# Patient Record
Sex: Male | Born: 1955
Health system: Southern US, Community
[De-identification: ages and names within clinical notes are randomized; demographics above are authoritative.]

## PROBLEM LIST (undated history)

## (undated) DIAGNOSIS — G43909 Migraine, unspecified, not intractable, without status migrainosus: Secondary | ICD-10-CM

## (undated) DIAGNOSIS — K5792 Diverticulitis of intestine, part unspecified, without perforation or abscess without bleeding: Secondary | ICD-10-CM

## (undated) HISTORY — DX: Diverticulitis of intestine, part unspecified, without perforation or abscess without bleeding: K57.92

## (undated) HISTORY — DX: Migraine, unspecified, not intractable, without status migrainosus: G43.909

---

## 2002-08-21 ENCOUNTER — Other Ambulatory Visit: Admission: RE | Admit: 2002-08-21 | Discharge: 2002-08-21 | Payer: Self-pay | Admitting: Dermatology

## 2007-04-13 HISTORY — PX: COLONOSCOPY: SHX174

## 2016-06-30 DIAGNOSIS — D485 Neoplasm of uncertain behavior of skin: Secondary | ICD-10-CM | POA: Diagnosis not present

## 2016-06-30 DIAGNOSIS — L570 Actinic keratosis: Secondary | ICD-10-CM | POA: Diagnosis not present

## 2016-06-30 DIAGNOSIS — Z85828 Personal history of other malignant neoplasm of skin: Secondary | ICD-10-CM | POA: Diagnosis not present

## 2017-01-04 DIAGNOSIS — L259 Unspecified contact dermatitis, unspecified cause: Secondary | ICD-10-CM | POA: Diagnosis not present

## 2017-01-20 DIAGNOSIS — K5700 Diverticulitis of small intestine with perforation and abscess without bleeding: Secondary | ICD-10-CM | POA: Diagnosis not present

## 2017-03-16 DIAGNOSIS — G43909 Migraine, unspecified, not intractable, without status migrainosus: Secondary | ICD-10-CM | POA: Diagnosis not present

## 2017-03-16 DIAGNOSIS — R7301 Impaired fasting glucose: Secondary | ICD-10-CM | POA: Diagnosis not present

## 2017-03-16 DIAGNOSIS — Z79899 Other long term (current) drug therapy: Secondary | ICD-10-CM | POA: Diagnosis not present

## 2017-03-24 DIAGNOSIS — R7301 Impaired fasting glucose: Secondary | ICD-10-CM | POA: Diagnosis not present

## 2017-03-24 DIAGNOSIS — R079 Chest pain, unspecified: Secondary | ICD-10-CM | POA: Diagnosis not present

## 2017-03-24 DIAGNOSIS — Z0001 Encounter for general adult medical examination with abnormal findings: Secondary | ICD-10-CM | POA: Diagnosis not present

## 2017-03-24 DIAGNOSIS — G43909 Migraine, unspecified, not intractable, without status migrainosus: Secondary | ICD-10-CM | POA: Diagnosis not present

## 2017-05-30 DIAGNOSIS — K5792 Diverticulitis of intestine, part unspecified, without perforation or abscess without bleeding: Secondary | ICD-10-CM | POA: Diagnosis not present

## 2017-07-04 DIAGNOSIS — L570 Actinic keratosis: Secondary | ICD-10-CM | POA: Diagnosis not present

## 2017-10-25 ENCOUNTER — Encounter (INDEPENDENT_AMBULATORY_CARE_PROVIDER_SITE_OTHER): Payer: Self-pay | Admitting: *Deleted

## 2017-11-11 DIAGNOSIS — M5136 Other intervertebral disc degeneration, lumbar region: Secondary | ICD-10-CM | POA: Diagnosis not present

## 2017-11-17 ENCOUNTER — Ambulatory Visit (INDEPENDENT_AMBULATORY_CARE_PROVIDER_SITE_OTHER): Payer: 59 | Admitting: Internal Medicine

## 2017-11-17 ENCOUNTER — Encounter (INDEPENDENT_AMBULATORY_CARE_PROVIDER_SITE_OTHER): Payer: Self-pay | Admitting: *Deleted

## 2017-11-17 ENCOUNTER — Encounter (INDEPENDENT_AMBULATORY_CARE_PROVIDER_SITE_OTHER): Payer: Self-pay | Admitting: Internal Medicine

## 2017-11-17 VITALS — BP 140/80 | HR 72 | Temp 98.0°F | Ht 72.0 in | Wt 223.3 lb

## 2017-11-17 DIAGNOSIS — K5732 Diverticulitis of large intestine without perforation or abscess without bleeding: Secondary | ICD-10-CM | POA: Insufficient documentation

## 2017-11-17 DIAGNOSIS — K625 Hemorrhage of anus and rectum: Secondary | ICD-10-CM

## 2017-11-17 NOTE — Patient Instructions (Signed)
The risks of bleeding, perforation and infection were reviewed with patient.  

## 2017-11-17 NOTE — Progress Notes (Signed)
   Subjective:    Patient ID: Kevin Potts, male    DOB: 04-Apr-1956, 62 y.o.   MRN: [id number]  HPI Referred by Dr Willey Blade for screening colonoscopy.  Has been 10 yrs since his last colonoscopy. Last episode of diverticulitis was in February and was treated with 2 antibiotics. Has had 3-4 bouts of diverticulitis in the past.   Last colonoscopy in 2009. Pancolonic diverticulosis. Most in sigmoid colon. (Dr. Laural Golden) Appetite is good. No weight loss. No abdominal pain.  BMs daily. Has seen blood on occasion on the toilet tissue.     03/16/2017 H and H 14.5 and 42.0   Retired from [employer].  Review of Systems     Past Medical History:  Diagnosis Date  . Diverticulitis   . Migraines     History reviewed. No pertinent surgical history.  No Known Allergies  Current Outpatient Medications on File Prior to Visit  Medication Sig Dispense Refill  . SUMAtriptan (IMITREX) 100 MG tablet Take 100 mg by mouth every 2 (two) hours as needed for migraine. May repeat in 2 hours if headache persists or recurs.    . topiramate (TOPAMAX) 100 MG tablet Take 100 mg by mouth daily.     No current facility-administered medications on file prior to visit.      Objective:   Physical Exam Blood pressure 140/80, pulse 72, temperature 98 F (36.7 C), height 6' (1.829 m), weight 273 lb 4.8 oz (124 kg). Alert and oriented. Skin warm and dry. Oral mucosa is moist.   . Sclera anicteric, conjunctivae is pink. Thyroid not enlarged. No cervical lymphadenopathy. Lungs clear. Heart regular rate and rhythm.  Abdomen is soft. Bowel sounds are positive. No hepatomegaly. No abdominal masses felt. No tenderness.  No edema to lower extremities.           Assessment & Plan:  Rectal bleeding on occasion. Needs colonoscopy. Diverticulitis. Has had about 4 bouts. Needs surveillance colonoscopy to be sure he does not have an underlying cancer.

## 2017-11-21 DIAGNOSIS — S338XXA Sprain of other parts of lumbar spine and pelvis, initial encounter: Secondary | ICD-10-CM | POA: Diagnosis not present

## 2017-11-21 DIAGNOSIS — S73101A Unspecified sprain of right hip, initial encounter: Secondary | ICD-10-CM | POA: Diagnosis not present

## 2017-11-23 DIAGNOSIS — S338XXA Sprain of other parts of lumbar spine and pelvis, initial encounter: Secondary | ICD-10-CM | POA: Diagnosis not present

## 2017-11-23 DIAGNOSIS — S73101A Unspecified sprain of right hip, initial encounter: Secondary | ICD-10-CM | POA: Diagnosis not present

## 2017-11-25 DIAGNOSIS — S338XXA Sprain of other parts of lumbar spine and pelvis, initial encounter: Secondary | ICD-10-CM | POA: Diagnosis not present

## 2017-11-25 DIAGNOSIS — S73101A Unspecified sprain of right hip, initial encounter: Secondary | ICD-10-CM | POA: Diagnosis not present

## 2017-11-28 ENCOUNTER — Ambulatory Visit (HOSPITAL_COMMUNITY)
Admission: RE | Admit: 2017-11-28 | Discharge: 2017-11-28 | Disposition: A | Discharge status: Home / Self Care | Payer: 59 | Source: Ambulatory Visit | Attending: Internal Medicine | Admitting: Internal Medicine

## 2017-11-28 ENCOUNTER — Encounter (HOSPITAL_COMMUNITY)
Admission: RE | Disposition: A | Discharge status: Home / Self Care | Payer: Self-pay | Source: Ambulatory Visit | Attending: Internal Medicine

## 2017-11-28 ENCOUNTER — Other Ambulatory Visit: Payer: Self-pay

## 2017-11-28 DIAGNOSIS — G43909 Migraine, unspecified, not intractable, without status migrainosus: Secondary | ICD-10-CM | POA: Insufficient documentation

## 2017-11-28 DIAGNOSIS — D214 Benign neoplasm of connective and other soft tissue of abdomen: Secondary | ICD-10-CM | POA: Diagnosis not present

## 2017-11-28 DIAGNOSIS — K648 Other hemorrhoids: Secondary | ICD-10-CM | POA: Diagnosis not present

## 2017-11-28 DIAGNOSIS — Z79899 Other long term (current) drug therapy: Secondary | ICD-10-CM | POA: Diagnosis not present

## 2017-11-28 DIAGNOSIS — K5730 Diverticulosis of large intestine without perforation or abscess without bleeding: Secondary | ICD-10-CM | POA: Insufficient documentation

## 2017-11-28 DIAGNOSIS — D123 Benign neoplasm of transverse colon: Secondary | ICD-10-CM | POA: Diagnosis not present

## 2017-11-28 DIAGNOSIS — K625 Hemorrhage of anus and rectum: Secondary | ICD-10-CM

## 2017-11-28 DIAGNOSIS — Z1211 Encounter for screening for malignant neoplasm of colon: Secondary | ICD-10-CM | POA: Diagnosis not present

## 2017-11-28 DIAGNOSIS — K644 Residual hemorrhoidal skin tags: Secondary | ICD-10-CM | POA: Insufficient documentation

## 2017-11-28 DIAGNOSIS — K5732 Diverticulitis of large intestine without perforation or abscess without bleeding: Secondary | ICD-10-CM

## 2017-11-28 HISTORY — PX: COLONOSCOPY: SHX5424

## 2017-11-28 HISTORY — PX: POLYPECTOMY: SHX5525

## 2017-11-28 SURGERY — COLONOSCOPY
Anesthesia: Moderate Sedation

## 2017-11-28 MED ORDER — SODIUM CHLORIDE 0.9 % IV SOLN
INTRAVENOUS | Status: DC
  Administered 2017-11-28: 09:00:00 via INTRAVENOUS

## 2017-11-28 MED ORDER — MIDAZOLAM HCL 5 MG/5ML IJ SOLN
INTRAMUSCULAR | Status: DC | PRN
  Administered 2017-11-28: 2 mg via INTRAVENOUS
  Administered 2017-11-28 (×2): 1 mg via INTRAVENOUS

## 2017-11-28 MED ORDER — MEPERIDINE HCL 50 MG/ML IJ SOLN
INTRAMUSCULAR | Status: AC
  Filled 2017-11-28: qty 1

## 2017-11-28 MED ORDER — MEPERIDINE HCL 50 MG/ML IJ SOLN
INTRAMUSCULAR | Status: DC | PRN
  Administered 2017-11-28: 25 mg

## 2017-11-28 MED ORDER — BENEFIBER DRINK MIX PO PACK: 4.0000 g | PACK | Freq: Every day | ORAL | Status: AC

## 2017-11-28 MED ORDER — MIDAZOLAM HCL 5 MG/5ML IJ SOLN
INTRAMUSCULAR | Status: AC
  Filled 2017-11-28: qty 10

## 2017-11-28 NOTE — Op Note (Addendum)
[facility] Patient Name: Kevin Potts Procedure Date: 11/28/2017 8:58 AM MRN: [id number] Date of Birth: 08-09-1955 Attending MD: Hildred Laser , MD CSN: [id number] Age: 62 Admit Type: Outpatient Procedure:                Colonoscopy Indications:              Screening for colorectal malignant neoplasm Providers:                Hildred Laser, MD, Janeece Riggers, RN Referring MD:             Asencion Noble, MD Medicines:                Meperidine 25 mg IV, Midazolam 4 mg IV Complications:            No immediate complications. Estimated Blood Loss:     Estimated blood loss was minimal. Procedure:                Pre-Anesthesia Assessment:                           - Prior to the procedure, a History and Physical                            was performed, and patient medications and                            allergies were reviewed. The patient's tolerance of                            previous anesthesia was also reviewed. The risks                            and benefits of the procedure and the sedation                            options and risks were discussed with the patient.                            All questions were answered, and informed consent                            was obtained. Prior Anticoagulants: The patient has                            taken no previous anticoagulant or antiplatelet                            agents. ASA Grade Assessment: I - A normal, healthy                            patient. After reviewing the risks and benefits,                            the patient was deemed in satisfactory condition to  undergo the procedure.                           After obtaining informed consent, the colonoscope                            was passed under direct vision. Throughout the                            procedure, the patient's blood pressure, pulse, and                            oxygen saturations were monitored continuously.  The                            [id number]-[id number] ([id number]) scope was introduced through                            the anus and advanced to the the cecum, identified                            by appendiceal orifice and ileocecal valve. The                            colonoscopy was performed without difficulty. The                            patient tolerated the procedure well. The quality                            of the bowel preparation was good. The ileocecal                            valve, appendiceal orifice, and rectum were                            photographed. Scope In: 9:21:54 AM Scope Out: 9:37:27 AM Scope Withdrawal Time: 0 hours 10 minutes 57 seconds  Total Procedure Duration: 0 hours 15 minutes 33 seconds  Findings:      The perianal and digital rectal examinations were normal.      Scattered medium-mouthed diverticula were found in the descending colon,       splenic flexure, transverse colon and hepatic flexure.      Multiple small and large-mouthed diverticula were found in the sigmoid       colon.      A small polyp was found in the hepatic flexure. The polyp was sessile.       Biopsies were taken with a cold forceps for histology.      External and internal hemorrhoids were found during retroflexion. The       hemorrhoids were small. Impression:               - Diverticulosis in the descending colon, at the  splenic flexure, in the transverse colon and at the                            hepatic flexure.                           - Diverticulosis in the sigmoid colon.                           - One small polyp at the hepatic flexure. Biopsied.                           - External and internal hemorrhoids. Moderate Sedation:      Moderate (conscious) sedation was administered by the endoscopy nurse       and supervised by the endoscopist. The following parameters were       monitored: oxygen saturation, heart rate, blood pressure, CO2        capnography and response to care. Total physician intraservice time was       23 minutes. Recommendation:           - Patient has a contact number available for                            emergencies. The signs and symptoms of potential                            delayed complications were discussed with the                            patient. Return to normal activities tomorrow.                            Written discharge instructions were provided to the                            patient.                           - High fiber diet today.                           - Continue present medications.                           - No aspirin, ibuprofen, naproxen, or other                            non-steroidal anti-inflammatory drugs for 1 day.                           - Use Benefiber 4 g po qhs.                           - Await pathology results.                           -  Repeat colonoscopy date to be determined after                            pending pathology results are reviewed. Procedure Code(s):        --- Professional ---                           [phone], Colonoscopy, flexible; with biopsy, single                            or multiple                           G0500, Moderate sedation services provided by the                            same physician or other qualified health care                            professional performing a gastrointestinal                            endoscopic service that sedation supports,                            requiring the presence of an independent trained                            observer to assist in the monitoring of the                            patient's level of consciousness and physiological                            status; initial 15 minutes of intra-service time;                            patient age 61 years or older (additional time may                            be reported with [phone], as appropriate)                            [phone], Moderate sedation services provided by the                            same physician or other qualified health care                            professional performing the diagnostic or                            therapeutic service that the sedation supports,  requiring the presence of an independent trained                            observer to assist in the monitoring of the                            patient's level of consciousness and physiological                            status; each additional 15 minutes intraservice                            time (List separately in addition to code for                            primary service) Diagnosis Code(s):        --- Professional ---                           Z12.11, Encounter for screening for malignant                            neoplasm of colon                           K64.8, Other hemorrhoids                           D12.3, Benign neoplasm of transverse colon (hepatic                            flexure or splenic flexure)                           K57.30, Diverticulosis of large intestine without                            perforation or abscess without bleeding CPT copyright 2017 American Medical Association. All rights reserved. The codes documented in this report are preliminary and upon coder review may  be revised to meet current compliance requirements. Hildred Laser, MD Hildred Laser, MD 11/28/2017 9:46:08 AM This report has been signed electronically. Number of Addenda: 0

## 2017-11-28 NOTE — Discharge Instructions (Signed)
Resume usual medications as before. Benefiber or equivalent 4 g p.o. Nightly. High-fiber diet. No driving for 24 hours. Physician will call with biopsy results.   Colonoscopy, Adult, Care After This sheet gives you information about how to care for yourself after your procedure. Your health care provider may also give you more specific instructions. If you have problems or questions, contact your health care provider. What can I expect after the procedure? After the procedure, it is common to have:  A small amount of blood in your stool for 24 hours after the procedure.  Some gas.  Mild abdominal cramping or bloating.  Follow these instructions at home: General instructions   For the first 24 hours after the procedure: ? Do not drive or use machinery. ? Do not sign important documents. ? Do not drink alcohol. ? Do your regular daily activities at a slower pace than normal. ? Eat soft, easy-to-digest foods. ? Rest often.  Take over-the-counter or prescription medicines only as told by your health care provider.  It is up to you to get the results of your procedure. Ask your health care provider, or the department performing the procedure, when your results will be ready. Relieving cramping and bloating  Try walking around when you have cramps or feel bloated.  Apply heat to your abdomen as told by your health care provider. Use a heat source that your health care provider recommends, such as a moist heat pack or a heating pad. ? Place a towel between your skin and the heat source. ? Leave the heat on for 20-30 minutes. ? Remove the heat if your skin turns bright red. This is especially important if you are unable to feel pain, heat, or cold. You may have a greater risk of getting burned. Eating and drinking  Drink enough fluid to keep your urine clear or pale yellow.  Resume your normal diet as instructed by your health care provider. Avoid heavy or fried foods that are  hard to digest.  Avoid drinking alcohol for as long as instructed by your health care provider. Contact a health care provider if:  You have blood in your stool 2-3 days after the procedure. Get help right away if:  You have more than a small spotting of blood in your stool.  You pass large blood clots in your stool.  Your abdomen is swollen.  You have nausea or vomiting.  You have a fever.  You have increasing abdominal pain that is not relieved with medicine. This information is not intended to replace advice given to you by your health care provider. Make sure you discuss any questions you have with your health care provider. Document Released: 11/11/2003 Document Revised: 12/22/2015 Document Reviewed: 06/10/2015 Elsevier Interactive Patient Education  2018 [employer].  Colon Polyps Polyps are tissue growths inside the body. Polyps can grow in many places, including the large intestine (colon). A polyp may be a round bump or a mushroom-shaped growth. You could have one polyp or several. Most colon polyps are noncancerous (benign). However, some colon polyps can become cancerous over time. What are the causes? The exact cause of colon polyps is not known. What increases the risk? This condition is more likely to develop in people who:  Have a family history of colon cancer or colon polyps.  Are older than 2 or older than 45 if they are African American.  Have inflammatory bowel disease, such as ulcerative colitis or Crohn disease.  Are overweight.  Smoke  cigarettes.  Do not get enough exercise.  Drink too much alcohol.  Eat a diet that is: ? High in fat and red meat. ? Low in fiber.  Had childhood cancer that was treated with abdominal radiation.  What are the signs or symptoms? Most polyps do not cause symptoms. If you have symptoms, they may include:  Blood coming from your rectum when having a bowel movement.  Blood in your stool.The stool may look  dark red or black.  A change in bowel habits, such as constipation or diarrhea.  How is this diagnosed? This condition is diagnosed with a colonoscopy. This is a procedure that uses a lighted, flexible scope to look at the inside of your colon. How is this treated? Treatment for this condition involves removing any polyps that are found. Those polyps will then be tested for cancer. If cancer is found, your health care provider will talk to you about options for colon cancer treatment. Follow these instructions at home: Diet  Eat plenty of fiber, such as fruits, vegetables, and whole grains.  Eat foods that are high in calcium and vitamin D, such as milk, cheese, yogurt, eggs, liver, fish, and broccoli.  Limit foods high in fat, red meats, and processed meats, such as hot dogs, sausage, bacon, and lunch meats.  Maintain a healthy weight, or lose weight if recommended by your health care provider. General instructions  Do not smoke cigarettes.  Do not drink alcohol excessively.  Keep all follow-up visits as told by your health care provider. This is important. This includes keeping regularly scheduled colonoscopies. Talk to your health care provider about when you need a colonoscopy.  Exercise every day or as told by your health care provider. Contact a health care provider if:  You have new or worsening bleeding during a bowel movement.  You have new or increased blood in your stool.  You have a change in bowel habits.  You unexpectedly lose weight. This information is not intended to replace advice given to you by your health care provider. Make sure you discuss any questions you have with your health care provider. Document Released: 12/24/2003 Document Revised: 09/04/2015 Document Reviewed: 02/17/2015 Elsevier Interactive Patient Education  [employer].  Diverticulosis Diverticulosis is a condition that develops when small pouches (diverticula) form in the wall of  the large intestine (colon). The colon is where water is absorbed and stool is formed. The pouches form when the inside layer of the colon pushes through weak spots in the outer layers of the colon. You may have a few pouches or many of them. What are the causes? The cause of this condition is not known. What increases the risk? The following factors may make you more likely to develop this condition:  Being older than age 49. Your risk for this condition increases with age. Diverticulosis is rare among people younger than age 8. By age 76, many people have it.  Eating a low-fiber diet.  Having frequent constipation.  Being overweight.  Not getting enough exercise.  Smoking.  Taking over-the-counter pain medicines, like aspirin and ibuprofen.  Having a family history of diverticulosis.  What are the signs or symptoms? In most people, there are no symptoms of this condition. If you do have symptoms, they may include:  Bloating.  Cramps in the abdomen.  Constipation or diarrhea.  Pain in the lower left side of the abdomen.  How is this diagnosed? This condition is most often diagnosed during an exam  for other colon problems. Because diverticulosis usually has no symptoms, it often cannot be diagnosed independently. This condition may be diagnosed by:  Using a flexible scope to examine the colon (colonoscopy).  Taking an X-ray of the colon after dye has been put into the colon (barium enema).  Doing a CT scan.  How is this treated? You may not need treatment for this condition if you have never developed an infection related to diverticulosis. If you have had an infection before, treatment may include:  Eating a high-fiber diet. This may include eating more fruits, vegetables, and grains.  Taking a fiber supplement.  Taking a live bacteria supplement (probiotic).  Taking medicine to relax your colon.  Taking antibiotic medicines.  Follow these instructions at  home:  Drink 6-8 glasses of water or more each day to prevent constipation.  Try not to strain when you have a bowel movement.  If you have had an infection before: ? Eat more fiber as directed by your health care provider or your diet and nutrition specialist (dietitian). ? Take a fiber supplement or probiotic, if your health care provider approves.  Take over-the-counter and prescription medicines only as told by your health care provider.  If you were prescribed an antibiotic, take it as told by your health care provider. Do not stop taking the antibiotic even if you start to feel better.  Keep all follow-up visits as told by your health care provider. This is important. Contact a health care provider if:  You have pain in your abdomen.  You have bloating.  You have cramps.  You have not had a bowel movement in 3 days. Get help right away if:  Your pain gets worse.  Your bloating becomes very bad.  You have a fever or chills, and your symptoms suddenly get worse.  You vomit.  You have bowel movements that are bloody or black.  You have bleeding from your rectum. Summary  Diverticulosis is a condition that develops when small pouches (diverticula) form in the wall of the large intestine (colon).  You may have a few pouches or many of them.  This condition is most often diagnosed during an exam for other colon problems.  If you have had an infection related to diverticulosis, treatment may include increasing the fiber in your diet, taking supplements, or taking medicines. This information is not intended to replace advice given to you by your health care provider. Make sure you discuss any questions you have with your health care provider. Document Released: 12/25/2003 Document Revised: 02/16/2016 Document Reviewed: 02/16/2016 Elsevier Interactive Patient Education  2017 [employer].

## 2017-11-28 NOTE — H&P (Signed)
Kevin Potts is an 62 y.o. male.   Chief Complaint: Patient is here for colonoscopy. HPI: Patient 62 year old Caucasian male who is here for screening colonoscopy.  Last exam was 10 years ago.  He states he was treated for diverticulitis in February this year and he had another episode year before.  He also gives history of intermittent hematochezia felt to be secondary to hemorrhoids.  He denies abdominal pain.  He does not take any aspirin or NSAIDs. Family history is negative for CRC.  Past Medical History:  Diagnosis Date  . Diverticulitis   . Migraines     Past Surgical History:  Procedure Laterality Date  . COLONOSCOPY  2009    No family history on file. Social History:  reports that he has never smoked. He has never used smokeless tobacco. He reports that he drinks alcohol. He reports that he does not use drugs.  Allergies: No Known Allergies  Medications Prior to Admission  Medication Sig Dispense Refill  . Multiple Vitamins-Minerals (MULTIVITAMIN WITH MINERALS) tablet Take 1 tablet by mouth daily.    . SUMAtriptan (IMITREX) 100 MG tablet Take 100 mg by mouth every 2 (two) hours as needed for migraine. May repeat in 2 hours if headache persists or recurs.    . topiramate (TOPAMAX) 100 MG tablet Take 100 mg by mouth daily.      No results found for this or any previous visit (from the past 48 hour(s)). No results found.  ROS  Blood pressure (!) 126/91, pulse 76, temperature 97.9 F (36.6 C), temperature source Oral, resp. rate 12, SpO2 100 %. Physical Exam  Constitutional: He appears well-developed and well-nourished.  HENT:  Mouth/Throat: Oropharynx is clear and moist.  Eyes: Conjunctivae are normal. No scleral icterus.  Neck: No thyromegaly present.  Cardiovascular: Normal rate, regular rhythm and normal heart sounds.  No murmur heard. Respiratory: Effort normal and breath sounds normal.  GI: Soft. He exhibits no distension and no mass. There is no tenderness.   Musculoskeletal: He exhibits no edema.  Lymphadenopathy:    He has no cervical adenopathy.  Neurological: He is alert.  Skin: Skin is warm and dry.     Assessment/Plan Average risk screening colonoscopy.  Kevin Laser, MD 11/28/2017, 9:11 AM

## 2017-11-30 DIAGNOSIS — S338XXA Sprain of other parts of lumbar spine and pelvis, initial encounter: Secondary | ICD-10-CM | POA: Diagnosis not present

## 2017-11-30 DIAGNOSIS — S73101A Unspecified sprain of right hip, initial encounter: Secondary | ICD-10-CM | POA: Diagnosis not present

## 2017-12-01 ENCOUNTER — Encounter (HOSPITAL_COMMUNITY): Payer: Self-pay | Admitting: Internal Medicine

## 2017-12-02 DIAGNOSIS — S338XXA Sprain of other parts of lumbar spine and pelvis, initial encounter: Secondary | ICD-10-CM | POA: Diagnosis not present

## 2017-12-02 DIAGNOSIS — S73101A Unspecified sprain of right hip, initial encounter: Secondary | ICD-10-CM | POA: Diagnosis not present

## 2017-12-07 DIAGNOSIS — S338XXA Sprain of other parts of lumbar spine and pelvis, initial encounter: Secondary | ICD-10-CM | POA: Diagnosis not present

## 2017-12-07 DIAGNOSIS — S73101A Unspecified sprain of right hip, initial encounter: Secondary | ICD-10-CM | POA: Diagnosis not present

## 2017-12-21 DIAGNOSIS — S338XXA Sprain of other parts of lumbar spine and pelvis, initial encounter: Secondary | ICD-10-CM | POA: Diagnosis not present

## 2017-12-21 DIAGNOSIS — S73101A Unspecified sprain of right hip, initial encounter: Secondary | ICD-10-CM | POA: Diagnosis not present

## 2018-01-27 DIAGNOSIS — Z23 Encounter for immunization: Secondary | ICD-10-CM | POA: Diagnosis not present

## 2018-02-22 DIAGNOSIS — S73101A Unspecified sprain of right hip, initial encounter: Secondary | ICD-10-CM | POA: Diagnosis not present

## 2018-02-22 DIAGNOSIS — S338XXA Sprain of other parts of lumbar spine and pelvis, initial encounter: Secondary | ICD-10-CM | POA: Diagnosis not present

## 2018-03-17 DIAGNOSIS — N529 Male erectile dysfunction, unspecified: Secondary | ICD-10-CM | POA: Diagnosis not present

## 2018-03-17 DIAGNOSIS — Z79899 Other long term (current) drug therapy: Secondary | ICD-10-CM | POA: Diagnosis not present

## 2018-03-17 DIAGNOSIS — G43909 Migraine, unspecified, not intractable, without status migrainosus: Secondary | ICD-10-CM | POA: Diagnosis not present

## 2018-03-17 DIAGNOSIS — R7301 Impaired fasting glucose: Secondary | ICD-10-CM | POA: Diagnosis not present

## 2018-03-18 DIAGNOSIS — K5732 Diverticulitis of large intestine without perforation or abscess without bleeding: Secondary | ICD-10-CM | POA: Diagnosis not present

## 2018-03-24 DIAGNOSIS — G43909 Migraine, unspecified, not intractable, without status migrainosus: Secondary | ICD-10-CM | POA: Diagnosis not present

## 2018-03-24 DIAGNOSIS — Z0001 Encounter for general adult medical examination with abnormal findings: Secondary | ICD-10-CM | POA: Diagnosis not present

## 2018-03-24 DIAGNOSIS — K5792 Diverticulitis of intestine, part unspecified, without perforation or abscess without bleeding: Secondary | ICD-10-CM | POA: Diagnosis not present

## 2018-04-26 ENCOUNTER — Ambulatory Visit (INDEPENDENT_AMBULATORY_CARE_PROVIDER_SITE_OTHER): Payer: 59 | Admitting: Urology

## 2018-04-26 ENCOUNTER — Other Ambulatory Visit (HOSPITAL_COMMUNITY): Payer: Self-pay | Admitting: Urology

## 2018-04-26 ENCOUNTER — Other Ambulatory Visit: Payer: Self-pay | Admitting: Urology

## 2018-04-26 DIAGNOSIS — N402 Nodular prostate without lower urinary tract symptoms: Secondary | ICD-10-CM

## 2018-05-10 ENCOUNTER — Encounter (HOSPITAL_COMMUNITY): Payer: Self-pay

## 2018-05-10 ENCOUNTER — Ambulatory Visit (HOSPITAL_COMMUNITY)
Admission: RE | Admit: 2018-05-10 | Discharge: 2018-05-10 | Disposition: A | Discharge status: Home / Self Care | Payer: 59 | Source: Ambulatory Visit | Attending: Urology | Admitting: Urology

## 2018-05-10 DIAGNOSIS — N402 Nodular prostate without lower urinary tract symptoms: Secondary | ICD-10-CM

## 2018-05-10 DIAGNOSIS — C61 Malignant neoplasm of prostate: Secondary | ICD-10-CM | POA: Diagnosis not present

## 2018-05-10 MED ORDER — LIDOCAINE HCL (PF) 2 % IJ SOLN
INTRAMUSCULAR | Status: AC
  Filled 2018-05-10: qty 10

## 2018-05-10 MED ORDER — GENTAMICIN SULFATE 40 MG/ML IJ SOLN
80.0000 mg | Freq: Once | INTRAMUSCULAR | Status: AC
  Administered 2018-05-10: 80 mg via INTRAMUSCULAR

## 2018-05-10 MED ORDER — LIDOCAINE HCL (PF) 2 % IJ SOLN
INTRAMUSCULAR | Status: AC
  Administered 2018-05-10: 10 mL
  Filled 2018-05-10: qty 20

## 2018-05-10 MED ORDER — GENTAMICIN SULFATE 40 MG/ML IJ SOLN
INTRAMUSCULAR | Status: AC
  Administered 2018-05-10: 80 mg via INTRAMUSCULAR
  Filled 2018-05-10: qty 2

## 2018-05-17 DIAGNOSIS — S338XXA Sprain of other parts of lumbar spine and pelvis, initial encounter: Secondary | ICD-10-CM | POA: Diagnosis not present

## 2018-05-17 DIAGNOSIS — S73101A Unspecified sprain of right hip, initial encounter: Secondary | ICD-10-CM | POA: Diagnosis not present

## 2018-05-31 ENCOUNTER — Ambulatory Visit (INDEPENDENT_AMBULATORY_CARE_PROVIDER_SITE_OTHER): Payer: 59 | Admitting: Urology

## 2018-05-31 DIAGNOSIS — C61 Malignant neoplasm of prostate: Secondary | ICD-10-CM | POA: Diagnosis not present

## 2018-06-12 DIAGNOSIS — C61 Malignant neoplasm of prostate: Secondary | ICD-10-CM | POA: Diagnosis not present

## 2018-06-14 ENCOUNTER — Other Ambulatory Visit: Payer: Self-pay | Admitting: Urology

## 2018-06-22 DIAGNOSIS — M6281 Muscle weakness (generalized): Secondary | ICD-10-CM | POA: Diagnosis not present

## 2018-06-22 DIAGNOSIS — C61 Malignant neoplasm of prostate: Secondary | ICD-10-CM | POA: Diagnosis not present

## 2018-07-04 DIAGNOSIS — N393 Stress incontinence (female) (male): Secondary | ICD-10-CM | POA: Diagnosis not present

## 2018-07-04 DIAGNOSIS — M6281 Muscle weakness (generalized): Secondary | ICD-10-CM | POA: Diagnosis not present

## 2018-07-04 DIAGNOSIS — M62838 Other muscle spasm: Secondary | ICD-10-CM | POA: Diagnosis not present

## 2018-07-05 DIAGNOSIS — L570 Actinic keratosis: Secondary | ICD-10-CM | POA: Diagnosis not present

## 2018-07-31 DIAGNOSIS — N393 Stress incontinence (female) (male): Secondary | ICD-10-CM | POA: Diagnosis not present

## 2018-07-31 DIAGNOSIS — M62838 Other muscle spasm: Secondary | ICD-10-CM | POA: Diagnosis not present

## 2018-07-31 DIAGNOSIS — M6281 Muscle weakness (generalized): Secondary | ICD-10-CM | POA: Diagnosis not present

## 2018-08-23 ENCOUNTER — Other Ambulatory Visit: Payer: Self-pay | Admitting: Urology

## 2018-08-25 NOTE — Progress Notes (Signed)
SPOKE W/  _george      SCREENING SYMPTOMS OF COVID 19:   COUGH--  RUNNY NOSE---   SORE THROAT---  NASAL CONGESTION----  SNEEZING----  SHORTNESS OF BREATH---  DIFFICULTY BREATHING---  TEMP >100.0 -----  UNEXPLAINED BODY ACHES------  CHILLS --------   HEADACHES ---------  LOSS OF SMELL/ TASTE --------    HAVE YOU OR ANY FAMILY MEMBER TRAVELLED PAST 14 DAYS OUT OF THE   COUNT[place]no  HAVE YOU OR ANY FAMILY MEMBER BEEN EXPOSED TO ANYONE WITH COVID 19?  no

## 2018-08-25 NOTE — Patient Instructions (Signed)
Kevin Potts  08/25/2018      Your procedure is scheduled on:  08/25/2018   Report to [facility] Main  Entrance,  Report to Short Stay  at  5:30 AM   [place] [facility] TEST ON Tuesday 09-05-2018.  THIS TEST MUST BE DONE BEFORE SURGERY, DRIVE UP TO [place],  ENTRANCE [address]ES [address]  BETWEEN THE HOURS OF 9:00 AM AND 3:00 PM     Call this number if you have problems the morning of surgery [phone]       Remember: Do not eat food or drink liquids :After Midnight.   BRUSH YOUR TEETH MORNING OF SURGERY AND RINSE YOUR MOUTH OUT, NO CHEWING GUM CANDY OR MINTS.     Take these medicines the morning of surgery with A SIP OF WATER:   Topiramate (topamax), Imitrex if needed                                 You may not have any metal on your body including piercings              Do not wear jewelry, lotions, powders or perfumes, deodorant                          Men may shave face and neck.   Do not bring valuables to the hospital. [place].  Contacts, dentures or bridgework may not be worn into surgery.        Special Instructions:  Your are to drink one 8oz bottle of magnesium citrate at noon day before surgery                _____________________________________________________________________             [facility] - Preparing for Surgery Before surgery, you can play an important role.  Because skin is not sterile, your skin needs to be as free of germs as possible.  You can reduce the number of germs on your skin by washing with CHG (chlorahexidine gluconate) soap before surgery.  CHG is an antiseptic cleaner which kills germs and bonds with the skin to continue killing germs even after washing. Please DO NOT use if you have an allergy to CHG or antibacterial soaps.  If your skin becomes reddened/irritated stop using the CHG and inform your  nurse when you arrive at Short Stay. Do not shave (including legs and underarms) for at least 48 hours prior to the first CHG shower.  You may shave your face/neck. Please follow these instructions carefully:  1.  Shower with CHG Soap the night before surgery and the  morning of Surgery.  2.  If you choose to wash your hair, wash your hair first as usual with your  normal  shampoo.  3.  After you shampoo, rinse your hair and body thoroughly to remove the  shampoo.                            4.  Use CHG as you would any other liquid soap.  You can apply chg directly  to the skin  and wash                       Gently with a scrungie or clean washcloth.  5.  Apply the CHG Soap to your body ONLY FROM THE NECK DOWN.   Do not use on face/ open                           Wound or open sores. Avoid contact with eyes, ears mouth and genitals (private parts).                       Wash face,  Genitals (private parts) with your normal soap.             6.  Wash thoroughly, paying special attention to the area where your surgery  will be performed.  7.  Thoroughly rinse your body with warm water from the neck down.  8.  DO NOT shower/wash with your normal soap after using and rinsing off  the CHG Soap.             9.  Pat yourself dry with a clean towel.            10.  Wear clean pajamas.            11.  Place clean sheets on your bed the night of your first shower and do not  sleep with pets. Day of Surgery : Do not apply any lotions/deodorants the morning of surgery.  Please wear clean clothes to the hospital/surgery center.  FAILURE TO FOLLOW THESE INSTRUCTIONS MAY RESULT IN THE CANCELLATION OF YOUR SURGERY PATIENT SIGNATURE_________________________________  NURSE SIGNATURE__________________________________  ________________________________________________________________________

## 2018-08-28 ENCOUNTER — Encounter (HOSPITAL_COMMUNITY): Payer: Self-pay

## 2018-08-28 ENCOUNTER — Other Ambulatory Visit: Payer: Self-pay

## 2018-08-28 ENCOUNTER — Encounter (HOSPITAL_COMMUNITY)
Admission: RE | Admit: 2018-08-28 | Discharge: 2018-08-28 | Disposition: A | Discharge status: Home / Self Care | Payer: 59 | Source: Ambulatory Visit | Attending: Urology | Admitting: Urology

## 2018-08-28 DIAGNOSIS — C61 Malignant neoplasm of prostate: Secondary | ICD-10-CM | POA: Diagnosis not present

## 2018-08-28 DIAGNOSIS — R8271 Bacteriuria: Secondary | ICD-10-CM | POA: Diagnosis not present

## 2018-08-28 DIAGNOSIS — Z01818 Encounter for other preprocedural examination: Secondary | ICD-10-CM | POA: Diagnosis not present

## 2018-08-28 LAB — CBC
HCT: 43.1 % (ref 39.0–52.0)
Hemoglobin: 14.4 g/dL (ref 13.0–17.0)
MCH: 30.4 pg (ref 26.0–34.0)
MCHC: 33.4 g/dL (ref 30.0–36.0)
MCV: 90.9 fL (ref 80.0–100.0)
Platelets: 206 10*3/uL (ref 150–400)
RBC: 4.74 MIL/uL (ref 4.22–5.81)
RDW: 13.2 % (ref 11.5–15.5)
WBC: 5.6 10*3/uL (ref 4.0–10.5)
nRBC: 0 % (ref 0.0–0.2)

## 2018-08-28 LAB — BASIC METABOLIC PANEL
Anion gap: 7 (ref 5–15)
BUN: 14 mg/dL (ref 8–23)
CO2: 24 mmol/L (ref 22–32)
Calcium: 9 mg/dL (ref 8.9–10.3)
Chloride: 110 mmol/L (ref 98–111)
Creatinine, Ser: 1.04 mg/dL (ref 0.61–1.24)
GFR calc Af Amer: >60 mL/min (ref >60)
GFR calc non Af Amer: >60 mL/min (ref >60)
Glucose, Bld: 103 mg/dL — ABNORMAL HIGH (ref 70–99)
Potassium: 4.2 mmol/L (ref 3.5–5.1)
Sodium: 141 mmol/L (ref 135–145)

## 2018-09-05 ENCOUNTER — Other Ambulatory Visit (HOSPITAL_COMMUNITY)
Admission: RE | Admit: 2018-09-05 | Discharge: 2018-09-05 | Disposition: A | Discharge status: Home / Self Care | Payer: 59 | Source: Ambulatory Visit | Attending: Urology | Admitting: Urology

## 2018-09-05 DIAGNOSIS — Z1159 Encounter for screening for other viral diseases: Secondary | ICD-10-CM | POA: Insufficient documentation

## 2018-09-06 LAB — NOVEL CORONAVIRUS, NAA (HOSP ORDER, SEND-OUT TO REF LAB; TAT 18-24 HRS): SARS-CoV-2, NAA: NOT DETECTED

## 2018-09-07 NOTE — Progress Notes (Signed)
SPOKE W/  _[name]     SCREENING SYMPTOMS OF COVID 19:   COUGH--no  RUNNY NOSE--- no  SORE THROAT---no  NASAL CONGESTION----no  SNEEZING----no  SHORTNESS OF BREATH---no  DIFFICULTY BREATHING---no  TEMP >100.0 -----no  UNEXPLAINED BODY ACHES------no  CHILLS -------- no  HEADACHES ---------no  LOSS OF SMELL/ TASTE --------no    HAVE YOU OR ANY FAMILY MEMBER TRAVELLED PAST 14 DAYS OUT OF THE   COUNTY---no STATE----no COUNTRY----no  HAVE YOU OR ANY FAMILY MEMBER BEEN EXPOSED TO ANYONE WITH COVID 19?  no

## 2018-09-08 ENCOUNTER — Encounter (HOSPITAL_COMMUNITY): Admission: AD | Disposition: E | Payer: Self-pay | Source: Home / Self Care | Attending: Urology

## 2018-09-08 ENCOUNTER — Inpatient Hospital Stay (HOSPITAL_COMMUNITY)
Admission: AD | Admit: 2018-09-08 | Discharge: 2018-10-11 | DRG: 707 | Disposition: E | Payer: 59 | Attending: Urology | Admitting: Urology

## 2018-09-08 ENCOUNTER — Other Ambulatory Visit: Payer: Self-pay

## 2018-09-08 ENCOUNTER — Ambulatory Visit (HOSPITAL_COMMUNITY): Payer: 59 | Admitting: Physician Assistant

## 2018-09-08 ENCOUNTER — Encounter (HOSPITAL_COMMUNITY): Payer: Self-pay

## 2018-09-08 ENCOUNTER — Ambulatory Visit (HOSPITAL_COMMUNITY): Payer: 59 | Admitting: Anesthesiology

## 2018-09-08 DIAGNOSIS — D62 Acute posthemorrhagic anemia: Secondary | ICD-10-CM | POA: Diagnosis not present

## 2018-09-08 DIAGNOSIS — R079 Chest pain, unspecified: Secondary | ICD-10-CM

## 2018-09-08 DIAGNOSIS — Z1159 Encounter for screening for other viral diseases: Secondary | ICD-10-CM

## 2018-09-08 DIAGNOSIS — D689 Coagulation defect, unspecified: Secondary | ICD-10-CM | POA: Diagnosis not present

## 2018-09-08 DIAGNOSIS — J9601 Acute respiratory failure with hypoxia: Secondary | ICD-10-CM | POA: Diagnosis not present

## 2018-09-08 DIAGNOSIS — Y838 Other surgical procedures as the cause of abnormal reaction of the patient, or of later complication, without mention of misadventure at the time of the procedure: Secondary | ICD-10-CM | POA: Diagnosis not present

## 2018-09-08 DIAGNOSIS — Z66 Do not resuscitate: Secondary | ICD-10-CM | POA: Diagnosis present

## 2018-09-08 DIAGNOSIS — Z781 Physical restraint status: Secondary | ICD-10-CM

## 2018-09-08 DIAGNOSIS — E872 Acidosis: Secondary | ICD-10-CM | POA: Diagnosis not present

## 2018-09-08 DIAGNOSIS — E875 Hyperkalemia: Secondary | ICD-10-CM | POA: Diagnosis present

## 2018-09-08 DIAGNOSIS — I10 Essential (primary) hypertension: Secondary | ICD-10-CM | POA: Diagnosis present

## 2018-09-08 DIAGNOSIS — I2699 Other pulmonary embolism without acute cor pulmonale: Secondary | ICD-10-CM | POA: Diagnosis not present

## 2018-09-08 DIAGNOSIS — N170 Acute kidney failure with tubular necrosis: Secondary | ICD-10-CM | POA: Diagnosis not present

## 2018-09-08 DIAGNOSIS — R55 Syncope and collapse: Secondary | ICD-10-CM | POA: Diagnosis not present

## 2018-09-08 DIAGNOSIS — Z452 Encounter for adjustment and management of vascular access device: Secondary | ICD-10-CM

## 2018-09-08 DIAGNOSIS — I97121 Postprocedural cardiac arrest following other surgery: Secondary | ICD-10-CM | POA: Diagnosis not present

## 2018-09-08 DIAGNOSIS — Z4659 Encounter for fitting and adjustment of other gastrointestinal appliance and device: Secondary | ICD-10-CM

## 2018-09-08 DIAGNOSIS — C61 Malignant neoplasm of prostate: Principal | ICD-10-CM | POA: Diagnosis present

## 2018-09-08 DIAGNOSIS — K567 Ileus, unspecified: Secondary | ICD-10-CM

## 2018-09-08 DIAGNOSIS — Z79899 Other long term (current) drug therapy: Secondary | ICD-10-CM

## 2018-09-08 DIAGNOSIS — R739 Hyperglycemia, unspecified: Secondary | ICD-10-CM | POA: Diagnosis present

## 2018-09-08 DIAGNOSIS — R000 Tachycardia, unspecified: Secondary | ICD-10-CM | POA: Diagnosis not present

## 2018-09-08 DIAGNOSIS — J9690 Respiratory failure, unspecified, unspecified whether with hypoxia or hypercapnia: Secondary | ICD-10-CM

## 2018-09-08 DIAGNOSIS — R578 Other shock: Secondary | ICD-10-CM | POA: Diagnosis not present

## 2018-09-08 DIAGNOSIS — G9341 Metabolic encephalopathy: Secondary | ICD-10-CM | POA: Diagnosis present

## 2018-09-08 DIAGNOSIS — N179 Acute kidney failure, unspecified: Secondary | ICD-10-CM

## 2018-09-08 HISTORY — PX: LYMPHADENECTOMY: SHX5960

## 2018-09-08 HISTORY — PX: ROBOT ASSISTED LAPAROSCOPIC RADICAL PROSTATECTOMY: SHX5141

## 2018-09-08 LAB — HEMOGLOBIN AND HEMATOCRIT, BLOOD
HCT: 40.1 % (ref 39.0–52.0)
Hemoglobin: 13.3 g/dL (ref 13.0–17.0)

## 2018-09-08 SURGERY — PROSTATECTOMY, RADICAL, ROBOT-ASSISTED, LAPAROSCOPIC
Anesthesia: General

## 2018-09-08 MED ORDER — FENTANYL CITRATE (PF) 100 MCG/2ML IJ SOLN
INTRAMUSCULAR | Status: DC | PRN
  Administered 2018-09-08 (×2): 50 ug via INTRAVENOUS

## 2018-09-08 MED ORDER — PROPOFOL 10 MG/ML IV BOLUS
INTRAVENOUS | Status: AC
  Filled 2018-09-08: qty 20

## 2018-09-08 MED ORDER — SULFAMETHOXAZOLE-TRIMETHOPRIM 800-160 MG PO TABS: 1.0000 | ORAL_TABLET | Freq: Two times a day (BID) | ORAL | 0 refills | Status: AC

## 2018-09-08 MED ORDER — MIDAZOLAM HCL 2 MG/2ML IJ SOLN
INTRAMUSCULAR | Status: AC
  Filled 2018-09-08: qty 2

## 2018-09-08 MED ORDER — LACTATED RINGERS IR SOLN
Status: DC | PRN
  Administered 2018-09-08: 1000 mL

## 2018-09-08 MED ORDER — CEFAZOLIN SODIUM-DEXTROSE 2-4 GM/100ML-% IV SOLN
2.0000 g | INTRAVENOUS | Status: AC
  Administered 2018-09-08: 2 g via INTRAVENOUS
  Filled 2018-09-08: qty 100

## 2018-09-08 MED ORDER — TOPIRAMATE 100 MG PO TABS
100.0000 mg | ORAL_TABLET | Freq: Every day | ORAL | Status: DC
  Administered 2018-09-08 – 2018-09-10 (×3): 100 mg via ORAL
  Filled 2018-09-08 (×3): qty 1

## 2018-09-08 MED ORDER — SUGAMMADEX SODIUM 500 MG/5ML IV SOLN
INTRAVENOUS | Status: DC | PRN
  Administered 2018-09-08: 300 mg via INTRAVENOUS

## 2018-09-08 MED ORDER — MIDAZOLAM HCL 2 MG/2ML IJ SOLN
INTRAMUSCULAR | Status: DC | PRN
  Administered 2018-09-08: 2 mg via INTRAVENOUS

## 2018-09-08 MED ORDER — BACITRACIN-NEOMYCIN-POLYMYXIN 400-5-5000 EX OINT: 1.0000 "application " | TOPICAL_OINTMENT | Freq: Three times a day (TID) | CUTANEOUS | Status: DC | PRN

## 2018-09-08 MED ORDER — DIPHENHYDRAMINE HCL 50 MG/ML IJ SOLN: 12.5000 mg | Freq: Four times a day (QID) | INTRAMUSCULAR | Status: DC | PRN

## 2018-09-08 MED ORDER — HYDROMORPHONE HCL 1 MG/ML IJ SOLN
INTRAMUSCULAR | Status: AC
  Filled 2018-09-08: qty 1

## 2018-09-08 MED ORDER — DEXAMETHASONE SODIUM PHOSPHATE 10 MG/ML IJ SOLN
INTRAMUSCULAR | Status: DC | PRN
  Administered 2018-09-08: 10 mg via INTRAVENOUS

## 2018-09-08 MED ORDER — MEPERIDINE HCL 50 MG/ML IJ SOLN: 6.2500 mg | INTRAMUSCULAR | Status: DC | PRN

## 2018-09-08 MED ORDER — SODIUM CHLORIDE (PF) 0.9 % IJ SOLN
INTRAMUSCULAR | Status: DC | PRN
  Administered 2018-09-08: 20 mL

## 2018-09-08 MED ORDER — SUCCINYLCHOLINE CHLORIDE 200 MG/10ML IV SOSY
PREFILLED_SYRINGE | INTRAVENOUS | Status: DC | PRN
  Administered 2018-09-08: 120 mg via INTRAVENOUS

## 2018-09-08 MED ORDER — SODIUM CHLORIDE (PF) 0.9 % IJ SOLN
INTRAMUSCULAR | Status: AC
  Filled 2018-09-08: qty 20

## 2018-09-08 MED ORDER — ROCURONIUM BROMIDE 10 MG/ML (PF) SYRINGE
PREFILLED_SYRINGE | INTRAVENOUS | Status: DC | PRN
  Administered 2018-09-08: 5 mg via INTRAVENOUS
  Administered 2018-09-08: 60 mg via INTRAVENOUS

## 2018-09-08 MED ORDER — FENTANYL CITRATE (PF) 250 MCG/5ML IJ SOLN
INTRAMUSCULAR | Status: DC | PRN
  Administered 2018-09-08: 50 ug via INTRAVENOUS
  Administered 2018-09-08: 100 ug via INTRAVENOUS
  Administered 2018-09-08 (×2): 50 ug via INTRAVENOUS

## 2018-09-08 MED ORDER — DEXTROSE-NACL 5-0.45 % IV SOLN
INTRAVENOUS | Status: DC
  Administered 2018-09-08 – 2018-09-09 (×3): via INTRAVENOUS

## 2018-09-08 MED ORDER — LACTATED RINGERS IV SOLN
INTRAVENOUS | Status: DC
  Administered 2018-09-08 (×3): via INTRAVENOUS

## 2018-09-08 MED ORDER — HYDROMORPHONE HCL 1 MG/ML IJ SOLN
0.5000 mg | INTRAMUSCULAR | Status: DC | PRN
  Administered 2018-09-08 (×2): 1 mg via INTRAVENOUS
  Filled 2018-09-08 (×2): qty 1

## 2018-09-08 MED ORDER — SODIUM CHLORIDE 0.9 % IV SOLN
INTRAVENOUS | Status: DC | PRN
  Administered 2018-09-08: 10 ug/min via INTRAVENOUS

## 2018-09-08 MED ORDER — OXYCODONE HCL 5 MG PO TABS: 5.0000 mg | ORAL_TABLET | ORAL | Status: DC | PRN

## 2018-09-08 MED ORDER — MAGNESIUM CITRATE PO SOLN
1.0000 | Freq: Once | ORAL | Status: AC
  Administered 2018-09-08: 1 via ORAL

## 2018-09-08 MED ORDER — DOCUSATE SODIUM 100 MG PO CAPS
100.0000 mg | ORAL_CAPSULE | Freq: Two times a day (BID) | ORAL | Status: DC
  Administered 2018-09-08 – 2018-09-10 (×5): 100 mg via ORAL
  Filled 2018-09-08 (×5): qty 1

## 2018-09-08 MED ORDER — LACTATED RINGERS IV SOLN
INTRAVENOUS | Status: DC
  Administered 2018-09-08: 12:00:00 via INTRAVENOUS

## 2018-09-08 MED ORDER — FENTANYL CITRATE (PF) 100 MCG/2ML IJ SOLN
INTRAMUSCULAR | Status: AC
  Filled 2018-09-08: qty 2

## 2018-09-08 MED ORDER — ACETAMINOPHEN 500 MG PO TABS
1000.0000 mg | ORAL_TABLET | Freq: Four times a day (QID) | ORAL | Status: AC
  Administered 2018-09-08 – 2018-09-09 (×4): 1000 mg via ORAL
  Filled 2018-09-08 (×4): qty 2

## 2018-09-08 MED ORDER — ONDANSETRON HCL 4 MG/2ML IJ SOLN: 4.0000 mg | INTRAMUSCULAR | Status: DC | PRN

## 2018-09-08 MED ORDER — FENTANYL CITRATE (PF) 250 MCG/5ML IJ SOLN
INTRAMUSCULAR | Status: AC
  Filled 2018-09-08: qty 5

## 2018-09-08 MED ORDER — HYDROCODONE-ACETAMINOPHEN 5-325 MG PO TABS: 1.0000 | ORAL_TABLET | Freq: Four times a day (QID) | ORAL | 0 refills | Status: AC | PRN

## 2018-09-08 MED ORDER — LIDOCAINE 2% (20 MG/ML) 5 ML SYRINGE
INTRAMUSCULAR | Status: DC | PRN
  Administered 2018-09-08: 80 mg via INTRAVENOUS

## 2018-09-08 MED ORDER — ONDANSETRON HCL 4 MG/2ML IJ SOLN
INTRAMUSCULAR | Status: DC | PRN
  Administered 2018-09-08: 4 mg via INTRAVENOUS

## 2018-09-08 MED ORDER — PROPOFOL 10 MG/ML IV BOLUS
INTRAVENOUS | Status: DC | PRN
  Administered 2018-09-08: 180 mg via INTRAVENOUS

## 2018-09-08 MED ORDER — BELLADONNA ALKALOIDS-OPIUM 16.2-60 MG RE SUPP
1.0000 | Freq: Four times a day (QID) | RECTAL | Status: DC | PRN
  Administered 2018-09-09: 12:00:00 1 via RECTAL
  Filled 2018-09-08: qty 1

## 2018-09-08 MED ORDER — SODIUM CHLORIDE 0.9 % IV BOLUS
1000.0000 mL | Freq: Once | INTRAVENOUS | Status: AC
  Administered 2018-09-08: 1000 mL via INTRAVENOUS

## 2018-09-08 MED ORDER — HYDROMORPHONE HCL 1 MG/ML IJ SOLN
0.2500 mg | INTRAMUSCULAR | Status: DC | PRN
  Administered 2018-09-08: 0.25 mg via INTRAVENOUS

## 2018-09-08 MED ORDER — BUPIVACAINE LIPOSOME 1.3 % IJ SUSP
20.0000 mL | Freq: Once | INTRAMUSCULAR | Status: AC
  Administered 2018-09-08: 20 mL
  Filled 2018-09-08: qty 20

## 2018-09-08 MED ORDER — METOCLOPRAMIDE HCL 5 MG/ML IJ SOLN: 10.0000 mg | Freq: Once | INTRAMUSCULAR | Status: DC | PRN

## 2018-09-08 MED ORDER — DIPHENHYDRAMINE HCL 12.5 MG/5ML PO ELIX: 12.5000 mg | ORAL_SOLUTION | Freq: Four times a day (QID) | ORAL | Status: DC | PRN

## 2018-09-08 MED ORDER — STERILE WATER FOR IRRIGATION IR SOLN
Status: DC | PRN
  Administered 2018-09-08: 1000 mL

## 2018-09-08 SURGICAL SUPPLY — 63 items
APPLICATOR COTTON TIP 6 STRL (MISCELLANEOUS) ×2 IMPLANT
APPLICATOR COTTON TIP 6IN STRL (MISCELLANEOUS) ×3
CATH FOLEY 2WAY SLVR 18FR 30CC (CATHETERS) ×3 IMPLANT
CATH TIEMANN FOLEY 18FR 5CC (CATHETERS) ×3 IMPLANT
CHLORAPREP W/TINT 26 (MISCELLANEOUS) ×3 IMPLANT
CLIP VESOLOCK LG 6/CT PURPLE (CLIP) ×6 IMPLANT
CONT SPEC 4OZ CLIKSEAL STRL BL (MISCELLANEOUS) ×3 IMPLANT
COVER SURGICAL LIGHT HANDLE (MISCELLANEOUS) ×3 IMPLANT
COVER TIP SHEARS 8 DVNC (MISCELLANEOUS) ×2 IMPLANT
COVER TIP SHEARS 8MM DA VINCI (MISCELLANEOUS) ×1
COVER WAND RF STERILE (DRAPES) IMPLANT
CUTTER ECHEON FLEX ENDO 45 340 (ENDOMECHANICALS) ×3 IMPLANT
DECANTER SPIKE VIAL GLASS SM (MISCELLANEOUS) ×3 IMPLANT
DERMABOND ADVANCED (GAUZE/BANDAGES/DRESSINGS) ×1
DERMABOND ADVANCED .7 DNX12 (GAUZE/BANDAGES/DRESSINGS) ×2 IMPLANT
DRAIN CHANNEL RND F F (WOUND CARE) IMPLANT
DRAPE ARM DVNC X/XI (DISPOSABLE) ×8 IMPLANT
DRAPE COLUMN DVNC XI (DISPOSABLE) ×2 IMPLANT
DRAPE DA VINCI XI ARM (DISPOSABLE) ×4
DRAPE DA VINCI XI COLUMN (DISPOSABLE) ×1
DRAPE SURG IRRIG POUCH 19X23 (DRAPES) ×3 IMPLANT
DRSG TEGADERM 4X4.75 (GAUZE/BANDAGES/DRESSINGS) ×3 IMPLANT
ELECT PENCIL ROCKER SW 15FT (MISCELLANEOUS) ×3 IMPLANT
ELECT REM PT RETURN 15FT ADLT (MISCELLANEOUS) ×3 IMPLANT
GAUZE 4X4 16PLY RFD (DISPOSABLE) IMPLANT
GAUZE SPONGE 2X2 8PLY STRL LF (GAUZE/BANDAGES/DRESSINGS) IMPLANT
GLOVE BIO SURGEON STRL SZ 6.5 (GLOVE) ×3 IMPLANT
GLOVE BIOGEL M STRL SZ7.5 (GLOVE) ×6 IMPLANT
GLOVE BIOGEL PI IND STRL 7.5 (GLOVE) ×2 IMPLANT
GLOVE BIOGEL PI INDICATOR 7.5 (GLOVE) ×1
GOWN STRL REUS W/TWL LRG LVL3 (GOWN DISPOSABLE) ×9 IMPLANT
HOLDER FOLEY CATH W/STRAP (MISCELLANEOUS) ×3 IMPLANT
IRRIG SUCT STRYKERFLOW 2 WTIP (MISCELLANEOUS) ×3
IRRIGATION SUCT STRKRFLW 2 WTP (MISCELLANEOUS) ×2 IMPLANT
IV LACTATED RINGERS 1000ML (IV SOLUTION) ×3 IMPLANT
KIT PROCEDURE DA VINCI SI (MISCELLANEOUS) ×1
KIT PROCEDURE DVNC SI (MISCELLANEOUS) ×2 IMPLANT
KIT TURNOVER KIT A (KITS) ×3 IMPLANT
NEEDLE INSUFFLATION 14GA 120MM (NEEDLE) ×3 IMPLANT
NEEDLE SPNL 22GX7 QUINCKE BK (NEEDLE) ×3 IMPLANT
PACK ROBOT UROLOGY CUSTOM (CUSTOM PROCEDURE TRAY) ×3 IMPLANT
PAD POSITIONING PINK XL (MISCELLANEOUS) ×3 IMPLANT
PORT ACCESS TROCAR AIRSEAL 12 (TROCAR) ×2 IMPLANT
PORT ACCESS TROCAR AIRSEAL 5M (TROCAR) ×1
SEAL CANN UNIV 5-8 DVNC XI (MISCELLANEOUS) ×8 IMPLANT
SEAL XI 5MM-8MM UNIVERSAL (MISCELLANEOUS) ×4
SET TRI-LUMEN FLTR TB AIRSEAL (TUBING) ×3 IMPLANT
SOLUTION ELECTROLUBE (MISCELLANEOUS) ×3 IMPLANT
SPONGE GAUZE 2X2 STER 10/PKG (GAUZE/BANDAGES/DRESSINGS)
SPONGE LAP 4X18 RFD (DISPOSABLE) ×3 IMPLANT
STAPLE RELOAD 45 GRN (STAPLE) ×2 IMPLANT
STAPLE RELOAD 45MM GREEN (STAPLE) ×1
SUT ETHILON 3 0 PS 1 (SUTURE) ×3 IMPLANT
SUT MNCRL AB 4-0 PS2 18 (SUTURE) ×6 IMPLANT
SUT PDS AB 1 CT1 27 (SUTURE) ×6 IMPLANT
SUT VIC AB 2-0 SH 27 (SUTURE) ×1
SUT VIC AB 2-0 SH 27X BRD (SUTURE) ×2 IMPLANT
SUT VICRYL 0 UR6 27IN ABS (SUTURE) ×3 IMPLANT
SUT VLOC BARB 180 ABS3/0GR12 (SUTURE) ×9
SUTURE VLOC BRB 180 ABS3/0GR12 (SUTURE) ×6 IMPLANT
SYR 27GX1/2 1ML LL SAFETY (SYRINGE) ×3 IMPLANT
TOWEL OR NON WOVEN STRL DISP B (DISPOSABLE) ×3 IMPLANT
WATER STERILE IRR 1000ML POUR (IV SOLUTION) ×3 IMPLANT

## 2018-09-08 NOTE — Op Note (Signed)
Kevin Potts, Kevin Potts MEDICAL RECORD [id number]:[id number] ACCOUNT [id number] DATE OF BIRTH:May 27, 1955 FACILITY: [facility] LOCATION: [facility] PHYSICIAN:Ariyel Jeangilles Tresa Moore, MD  OPERATIVE REPORT  DATE OF PROCEDURE:  08/23/2018  PREOPERATIVE DIAGNOSIS:  Moderate risk prostate cancer.  PROCEDURE: 1.  Robotic-assisted laparoscopic radical prostatectomy. 2.  Bilateral pelvic laminectomy. 3.  Injection of an indocyanine green dye for sentinel lymph angiography.  ESTIMATED BLOOD LOSS:  100 mL.  COMPLICATIONS:  None.  SPECIMENS: 1.  Right external iliac lymph nodes. 2.  Right obturator lymph nodes. 3.  Left external iliac lymph nodes. 4.  Left obturator lymph nodes. 5.  Periprosthetic fat. 6.  Radical prostatectomy.  FINDINGS:  No obvious sentinel lymph nodes in the pelvis.  INDICATIONS:  The patient is a very pleasant and quite vigorous 63 year old man who was found on workup of a prostate nodule to have a fairly large volume moderate risk adenocarcinoma of the prostate.  It is clinically localized.  Options were discussed  for management including surveillance protocols versus ablative therapy for surgical extirpation, and he wished to proceed with surgery with curative intent.  Informed consent was obtained and placed in the medical record.  PROCEDURE IN DETAIL:  The patient being identified, the procedure being radical prostatectomy was confirmed.  Procedure timeout was performed.  Antibiotics administered.  General endotracheal anesthesia induced.  The patient was placed into a low  lithotomy position.  A sterile field was created by prepping and draping the base of the penis, perineum and proximal thighs using iodine and his infraxiphoid abdomen using chlorhexidine gluconate after clipper shaving.  He was further fastened to the  operative table using 3-inch tape over foam padding across the supraxiphoid chest.  His arms were tucked at his side using gel rolls for padding.  A test of steep  Trendelenburg positioning was performed and he was found to be suitably positioned.  Next,  a high-flow, low-pressure pneumoperitoneum was obtained using Veress technique in the infraumbilical midline and passed the aspiration and drop test and after Foley catheter was placed free to straight drain.  An 8 mm robotic camera port was then placed  into the same location.  Laparoscopic examination of the peritoneal cavity with no significant adhesions, no visceral injury.  Distal ports were placed as follows:  Right paramedian 8 mm robotic port, right far lateral 12 mm AirSeal assist port, right  paramedian 5 mm suction port, left paramedian 8 mm robotic port, left far lateral 8 mm robotic port.  Robot was docked and passed the electronic checks.  Initial attention was directed at development of the space of Retzius.  An incision was made lateral  to the right medial umbilical ligament from the area of the midline towards the area of the internal ring, coursing along the iliac vessels towards the area of the right ureter, and the right bladder was swept away from the pelvic sidewall towards the  area of the endopelvic fascia on the right side.  A mirror image dissection was performed on the left side.  Anterior attachments were taken down using cautery scissors.  This exposed the anterior base of the prostate, bladder junction, which was  defatted to better demarcate the bladder neck and prostate junction.  This fat was set aside and labeled as periprosthetic fat.  Next, 0.2 mL of indocyanine green dye was injected into each lobe of the prostate using a percutaneously placed robotically  guided spinal needle with intervening dye suctioning to prevent spillage, which did not occur.  Next, the endo fascia was  carefully swept away from the lateral aspect of the prostate in base to apex orientation on each side.  This exposed the dorsal  venous complex which controlled using vascular stapler, taking exquisite care  to avoid membranous urethral injury, which did not occur.  Then, approximately 10 minutes post-dye injection, the pelvis was inspected under near infrared fluorescent light.  Sentinel lymph angiography revealed several small lymphatic channels on the left and the right coursing towards the area of the pelvic lymph node field; however, there were no obvious sentinel lymph nodes in the pelvis.  As such, standard template  lymphadenectomy was performed first on the right side of the right external iliac group with the boundaries being right external iliac artery, vein, pelvic sidewall, iliac bifurcation.  Lymphostasis was achieved with cold clips, set aside and labeled  right external iliac lymph nodes.  Next the right obturator group was dissected free with the boundaries being right external iliac vein, pelvic sidewall, obturator nerve.  Lymphostasis was achieved with cold clips, set aside and labeled right obturator  lymph nodes.  The obturator nerve was inspected following these maneuvers and found to be uninjured.  A mirror image lymphadenectomy was performed on the left side on the external iliac and left obturator groups, respectively, and the left obturator  nerve was also inspected and found to be uninjured.  Attention was directed at bladder neck dissection.  The bladder neck was identified by moving the Foley catheter back and forth, and a lateral release was performed on each side followed by separation  of the bladder neck in anterior and posterior direction, keeping what appeared to be a rim of circular muscle fibers at each plane of dissection.  Posterior dissection was performed by incising approximately 7 mm inferior posterior to the prostate lip of  the prostate, entering the plane of Denonvilliers.  Bilateral seminal vesicles and vas deferens were encountered.  The vas was dissected for a distance of approximately 4 cm, ligated and placed on gentle superior traction.  Bilateral seminal  vesicles  were dissected to their tips and placed on gentle superior traction.  Dissection proceeded within the plane of Denonvilliers inferiorly towards the apex of the prostate.  This exposed the vascular pedicles on each side.  The pedicles were controlled  using a sequential clipping technique in a base to apex orientation.  For the apical 50% of the dissection, a moderate nerve sparing was performed on each side, avoiding clips or thermal energy to the area of the presumed periprostatic nerves.  Final  apical dissection was performed in the anterior plane, placing the prostate on gentle superior traction and transecting the membranous urethra coldly.  This completely freed up the prostate specimen.  IT was placed in an EndoCatch bag for later  retrieval.  Next, digital rectal exam was performed under laparoscopic vision with indicator glove.  No evidence of rectal violation was noted.  Next, posterior dissection was performed by reapproximating the posterior urethral plate to the posterior  bladder neck using a 3-0 V-Loc suture, bringing the structures into tension-free apposition.  Mucosa-to-mucosa anastomosis was performed using a 3-0 double-armed V-Loc suture, reapproximating the membranous urethra and bladder neck from the 6 o'clock to  12 o'clock position which resulted in excellent mucosal apposition.  A new Foley catheter was then placed per urethra, which irrigated quantitatively.  Sponge and needle counts were correct.  Hemostasis appeared excellent.  A closed suction drain was  brought out through the previous left lateral most robotic port  site into the area of the peritoneal cavity.  The right lateral most 12 mm assistant port site was closed with fascia using Carter-Thomason suture passer and 0 Vicryl.  The specimen was  retrieved by extending the previous camera port site inferiorly for a distance of approximately 3 cm, removing the prostatectomy specimen, setting aside for permanent  pathology.  The extraction site was closed with fascia using figure-of-eight PDS x3  followed by reapproximation of Scarpa's with a running Vicryl.  All incision sites were infiltrated with dilute lipolyzed Marcaine and closed at the level of skin using subcuticular Monocryl and Dermabond.  The procedure was then terminated.  The patient  tolerated the procedure well.  No immediate perioperative complications.  The patient was taken to postanesthesia care unit in stable condition.  Please note first assistant, Debbrah Alar, was crucial for all portions of the surgery today.  She provided invaluable retraction, vascular clipping, vascular stapling, specimen manipulation, and general first assistance.  LN/NUANCE  D:08/13/2018 T:09/05/2018 JOB:[id number]

## 2018-09-08 NOTE — Anesthesia Preprocedure Evaluation (Addendum)
Anesthesia Evaluation  Patient identified by MRN, date of birth, ID band Patient awake    Reviewed: Allergy & Precautions, NPO status , Patient's Chart, lab work & pertinent test results  Airway Mallampati: II  TM Distance: >3 FB Neck ROM: Full    Dental no notable dental hx. (+) Dental Advisory Given, Chipped   Pulmonary neg pulmonary ROS,    Pulmonary exam normal breath sounds clear to auscultation       Cardiovascular negative cardio ROS Normal cardiovascular exam Rhythm:Regular Rate:Normal     Neuro/Psych negative neurological ROS  negative psych ROS   GI/Hepatic negative GI ROS, Neg liver ROS,   Endo/Other  negative endocrine ROS  Renal/GU negative Renal ROS  negative genitourinary   Musculoskeletal negative musculoskeletal ROS (+)   Abdominal   Peds negative pediatric ROS (+)  Hematology negative hematology ROS (+)   Anesthesia Other Findings   Reproductive/Obstetrics negative OB ROS                            Anesthesia Physical Anesthesia Plan  ASA: II  Anesthesia Plan: General   Post-op Pain Management:    Induction: Intravenous  PONV Risk Score and Plan: 2 and Ondansetron, Dexamethasone, Treatment may vary due to age or medical condition and Midazolam  Airway Management Planned: Oral ETT  Additional Equipment:   Intra-op Plan:   Post-operative Plan: Extubation in OR  Informed Consent: I have reviewed the patients History and Physical, chart, labs and discussed the procedure including the risks, benefits and alternatives for the proposed anesthesia with the patient or authorized representative who has indicated his/her understanding and acceptance.     Dental advisory given  Plan Discussed with: CRNA  Anesthesia Plan Comments:         Anesthesia Quick Evaluation

## 2018-09-08 NOTE — Anesthesia Postprocedure Evaluation (Signed)
Anesthesia Post Note  Patient: Kevin Potts  Procedure(s) Performed: XI ROBOTIC ASSISTED LAPAROSCOPIC RADICAL PROSTATECTOMY (N/A ) LYMPHADENECTOMY (Bilateral )     Patient location during evaluation: PACU Anesthesia Type: General Level of consciousness: awake and alert Pain management: pain level controlled Vital Signs Assessment: post-procedure vital signs reviewed and stable Respiratory status: spontaneous breathing, nonlabored ventilation, respiratory function stable and patient connected to nasal cannula oxygen Cardiovascular status: blood pressure returned to baseline and stable Postop Assessment: no apparent nausea or vomiting Anesthetic complications: no    Last Vitals:  Vitals:   08/17/2018 1140 08/13/2018 1153  BP: 114/84 107/70  Pulse: 71 72  Resp: (!) 21   Temp: 36.5 C 37 C  SpO2: 100% 100%    Last Pain:  Vitals:   08/14/2018 1155  TempSrc:   PainSc: 5                  Montez Hageman

## 2018-09-08 NOTE — Discharge Instructions (Signed)
1- Drain Sites - You may have some mild persistent drainage from old drain site for several days, this is normal. This can be covered with cotton gauze for convenience.  2 - Stiches - Your stitches are all dissolvable. You may notice a "loose thread" at your incisions, these are normal and require no intervention. You may cut them flush to the skin with fingernail clippers if needed for comfort.  3 - Diet - No restrictions  4 - Activity - No heavy lifting / straining (any activities that require valsalva or "bearing down") x 4 weeks. Otherwise, no restrictions.  5 - Bathing - You may shower immediately. Do not take a bath or get into swimming pool where incision sites are submersed in water x 4 weeks.   6 - Catheter - Will remain in place until removed at your next appointment. It may be cleaned with soap and water in the shower. It may be disconnected from the drain bag while in the shower to avoid tripping over the tube. You may apply Neosporin or Vaseline ointment as needed to the tip of the penis where the catheter inserts to reduce friction and irritation in this spot.   7 - When to Call the Doctor - Call MD for any fever >102, any acute wound problems, or any severe nausea / vomiting. You can call the [facility] [phone]) 24 hours a day 365 days a year. It will roll-over to the answering service and on-call physician after hours.  You can restart aspirin, advil, aleve, vitamins, and supplements 7 days after surgery.

## 2018-09-08 NOTE — Brief Op Note (Signed)
09/07/2018  9:51 AM  PATIENT:  Kevin Potts  63 y.o. male  PRE-OPERATIVE DIAGNOSIS:  PROSTATE CANCER  POST-OPERATIVE DIAGNOSIS:  PROSTATE CANCER  PROCEDURE:  Procedure(s) with comments: XI ROBOTIC ASSISTED LAPAROSCOPIC RADICAL PROSTATECTOMY (N/A) - 3 HRS LYMPHADENECTOMY (Bilateral)  SURGEON:  Surgeon(s) and Role:    Alexis Frock, MD - Primary  PHYSICIAN ASSISTANT:   ASSISTANTS: Debbrah Alar PA   ANESTHESIA:   local and general  EBL:  200 mL   BLOOD ADMINISTERED:none  DRAINS: 1 - foley to gravity; 2 - JP to bulb   LOCAL MEDICATIONS USED:  MARCAINE     SPECIMEN:  Source of Specimen:  prostatectomy, pelvid lymph nodes, periprostatic fat  DISPOSITION OF SPECIMEN:  PATHOLOGY  COUNTS:  YES  TOURNIQUET:  * No tourniquets in log *  DICTATION: .Other Dictation: Dictation Number [id number]  PLAN OF CARE: Admit for overnight observation  PATIENT DISPOSITION:  PACU - hemodynamically stable.   Delay start of Pharmacological VTE agent (>24hrs) due to surgical blood loss or risk of bleeding: yes

## 2018-09-08 NOTE — Anesthesia Procedure Notes (Signed)
Date/Time: 08/21/2018 9:57 AM Performed by: Cynda Familia, CRNA Oxygen Delivery Method: Simple face mask Placement Confirmation: breath sounds checked- equal and bilateral and positive ETCO2 Dental Injury: Teeth and Oropharynx as per pre-operative assessment

## 2018-09-08 NOTE — Anesthesia Procedure Notes (Addendum)
Procedure Name: Intubation Date/Time: 08/22/2018 7:36 AM Performed by: Cynda Familia, CRNA Pre-anesthesia Checklist: Patient identified, Emergency Drugs available, Suction available and Patient being monitored Patient Re-evaluated:Patient Re-evaluated prior to induction Oxygen Delivery Method: Circle System Utilized Preoxygenation: Pre-oxygenation with 100% oxygen Induction Type: IV induction Ventilation: Mask ventilation without difficulty Laryngoscope Size: Miller and 2 Grade View: Grade III Tube type: Oral Number of attempts: 1 Airway Equipment and Method: Stylet and Oral airway Placement Confirmation: ETT inserted through vocal cords under direct vision,  positive ETCO2 and breath sounds checked- equal and bilateral Secured at: 23 cm Tube secured with: Tape Dental Injury: Teeth and Oropharynx as per pre-operative assessment  Comments: Smooth IV induction Carignan-- intubation AM CRNA -  Atraumatic-- pt with chipped front teeth prior to laryngoscopy-- pt did not report on preop interview--  bilat BS present --- pt anterior--- no view of cords-- in future consider Glidescope intubation

## 2018-09-08 NOTE — Plan of Care (Signed)
  Problem: Education: Goal: Knowledge of the procedure and recovery process will improve Outcome: Progressing   Problem: Bowel/Gastric: Goal: Gastrointestinal status for postoperative course will improve Outcome: Progressing   Problem: Pain Management: Goal: General experience of comfort will improve Outcome: Progressing   Problem: Skin Integrity: Goal: Demonstration of wound healing without infection will improve Outcome: Progressing   Problem: Urinary Elimination: Goal: Ability to avoid or minimize complications of infection will improve Outcome: Progressing Goal: Ability to achieve and maintain urine output will improve Outcome: Progressing Goal: Home care management will improve Outcome: Progressing

## 2018-09-08 NOTE — Transfer of Care (Signed)
Immediate Anesthesia Transfer of Care Note  Patient: Kevin Potts  Procedure(s) Performed: XI ROBOTIC ASSISTED LAPAROSCOPIC RADICAL PROSTATECTOMY (N/A ) LYMPHADENECTOMY (Bilateral )  Patient Location: PACU  Anesthesia Type:General  Level of Consciousness: sedated  Airway & Oxygen Therapy: Patient Spontanous Breathing and Patient connected to face mask oxygen  Post-op Assessment: Report given to RN and Post -op Vital signs reviewed and stable  Post vital signs: Reviewed and stable  Last Vitals:  Vitals Value Taken Time  BP 127/81 08/13/2018 10:08 AM  Temp    Pulse 74 09/05/2018 10:09 AM  Resp 17 09/06/2018 10:09 AM  SpO2 100 % 08/28/2018 10:09 AM  Vitals shown include unvalidated device data.  Last Pain:  Vitals:   08/15/2018 0558  TempSrc:   PainSc: 3       Patients Stated Pain Goal: 2 (40/81/44 8185)  Complications: No apparent anesthesia complications

## 2018-09-08 NOTE — H&P (Signed)
Kevin Potts is an 63 y.o. male.    Chief Complaint: Pre-OP Prostatectomy  HPI:   1 - Large Volume Moderate Risk Prostate Cancer - 9/12 cores up to 50% mix of grade 1 and grade 2 cancer on eval prostate nodule 04/2018. PSA 2.7. TRUS 29mL, no median lobe. Nodule astutely noted by Dr. Willey Blade.   PMH sig for HTN. NO ischemic CV disease / blood thinners. No prior surgeries. His PCP is Asencion Noble MD.   Today " Kevin Potts " is seen to proceed with curative intent prostatectomy. NO interval fevers. COVID screen negative.    Past Medical History:  Diagnosis Date  . Diverticulitis   . Migraines     Past Surgical History:  Procedure Laterality Date  . COLONOSCOPY  2009  . COLONOSCOPY N/A 11/28/2017   Procedure: COLONOSCOPY;  Surgeon: Rogene Houston, MD;  Location: [facility];  Service: Endoscopy;  Laterality: N/A;  9:15  . POLYPECTOMY  11/28/2017   Procedure: POLYPECTOMY;  Surgeon: Rogene Houston, MD;  Location: [facility];  Service: Endoscopy;;  hepatic flexure polyp    History reviewed. No pertinent family history. Social History:  reports that he has never smoked. He has never used smokeless tobacco. He reports current alcohol use. He reports that he does not use drugs.  Allergies: No Known Allergies  Medications Prior to Admission  Medication Sig Dispense Refill  . Multiple Vitamins-Minerals (MULTIVITAMIN WITH MINERALS) tablet Take 1 tablet by mouth daily.    . SUMAtriptan (IMITREX) 100 MG tablet Take 100 mg by mouth every 2 (two) hours as needed for migraine. May repeat in 2 hours if headache persists or recurs.    . topiramate (TOPAMAX) 100 MG tablet Take 100 mg by mouth daily.    . vitamin C (ASCORBIC ACID) 500 MG tablet Take 500 mg by mouth daily.    . Wheat Dextrin (BENEFIBER DRINK MIX) PACK Take 4 g by mouth at bedtime. (Patient not taking: Reported on 08/21/2018)      No results found for this or any previous visit (from the past 48 hour(s)). No results found.  Review of  Systems  Constitutional: Negative for chills and fever.  HENT: Negative.   Eyes: Negative.   Respiratory: Negative.   Cardiovascular: Negative.   Gastrointestinal: Negative.   Genitourinary: Negative.   Musculoskeletal: Negative.   Skin: Negative.   Neurological: Negative.   Endo/Heme/Allergies: Negative.   Psychiatric/Behavioral: Negative.     Blood pressure (!) 137/97, pulse 81, temperature 98.2 F (36.8 C), temperature source Oral, resp. rate 18, height 6' (1.829 m), weight 103 kg, SpO2 100 %. Physical Exam  Constitutional: He appears well-developed.  HENT:  Head: Normocephalic.  Eyes: Pupils are equal, round, and reactive to light.  Neck: Normal range of motion.  Cardiovascular: Normal rate.  Respiratory: Effort normal.  GI: Soft.  Genitourinary:    Genitourinary Comments: No CVAT   Musculoskeletal: Normal range of motion.  Neurological: He is alert.  Skin: Skin is warm.  Psychiatric: He has a normal mood and affect.     Assessment/Plan  1 - Large Volume Moderate Risk Prostate Cancer - proceed as planned with prostatectomy and node dissection. Risks, benefits, alternatives, expected peri-op course discussed previously and reiterated today.   Alexis Frock, MD 08/26/2018, 7:00 AM

## 2018-09-09 ENCOUNTER — Encounter (HOSPITAL_COMMUNITY): Payer: Self-pay | Admitting: Urology

## 2018-09-09 LAB — BASIC METABOLIC PANEL
Anion gap: 5 (ref 5–15)
BUN: 10 mg/dL (ref 8–23)
CO2: 22 mmol/L (ref 22–32)
Calcium: 8.4 mg/dL — ABNORMAL LOW (ref 8.9–10.3)
Chloride: 112 mmol/L — ABNORMAL HIGH (ref 98–111)
Creatinine, Ser: 1.04 mg/dL (ref 0.61–1.24)
GFR calc Af Amer: >60 mL/min (ref >60)
GFR calc non Af Amer: >60 mL/min (ref >60)
Glucose, Bld: 132 mg/dL — ABNORMAL HIGH (ref 70–99)
Potassium: 4 mmol/L (ref 3.5–5.1)
Sodium: 139 mmol/L (ref 135–145)

## 2018-09-09 LAB — HEMOGLOBIN AND HEMATOCRIT, BLOOD
HCT: 38.7 % — ABNORMAL LOW (ref 39.0–52.0)
Hemoglobin: 12.3 g/dL — ABNORMAL LOW (ref 13.0–17.0)

## 2018-09-09 MED ORDER — SIMETHICONE 80 MG PO CHEW
80.0000 mg | CHEWABLE_TABLET | Freq: Once | ORAL | Status: AC
  Administered 2018-09-09: 80 mg via ORAL
  Filled 2018-09-09: qty 1

## 2018-09-09 NOTE — Progress Notes (Signed)
1 Day Post-Op Subjective: The patient reports an episode of "gas pain" this morning that was alleviated with simethicone.  He denies nausea/vomiting this morning and is tolerating a CLD this AM.   No flatus yet. Pain is controlled.  He ambulated some last night, but has not been out of bed this morning.  Labs and catheter/drain output are appropriate.  Objective: Vital signs in last 24 hours: Temp:  [97.7 F (36.5 C)-99.4 F (37.4 C)] 98.6 F (37 C) (05/30 0523) Pulse Rate:  [66-87] 69 (05/30 0523) Resp:  [14-25] 16 (05/30 0523) BP: (103-127)/(69-87) 111/78 (05/30 0523) SpO2:  [95 %-100 %] 100 % (05/30 0523) Weight:  [106.1 kg] 106.1 kg (05/29 1200)  Intake/Output from previous day: 05/29 0701 - 05/30 0700 In: 4856.5 [P.O.:600; I.V.:3256.5; IV Piggyback:1000] Out: 2890 [Urine:2625; Drains:65; Blood:200]  Intake/Output this shift: No intake/output data recorded.  Physical Exam:  General: Alert and oriented CV: RRR, palpable distal pulses Lungs: CTAB, equal chest rise Abdomen: Soft, NTND, no rebound or guarding Incisions: c/d/i.  JP drain in place with serosanguineous output Gu: Foley catheter in place and draining amber urine Ext: NT, No erythema  Lab Results: Recent Labs    08/31/2018 1025 09/09/18 0556  HGB 13.3 12.3*  HCT 40.1 38.7*   BMET Recent Labs    09/09/18 0556  NA 139  K 4.0  CL 112*  CO2 22  GLUCOSE 132*  BUN 10  CREATININE 1.04  CALCIUM 8.4*     Studies/Results: No results found.  Assessment/Plan: Grade 2 prostate cancer, s/p RALP on 08/16/2018 with Dr. Tresa Moore  -DC JP drain -Advance diet as tolerated -Out of bed to chair and ambulate -Likely discharge home later this afternoon so long as his abdominal discomfort is controlled -Home with Foley catheter   LOS: 0 days   Ellison Hughs, MD [facility] Pager: [phone]  09/09/2018, 9:10 AM

## 2018-09-09 NOTE — Progress Notes (Signed)
Patient ambulated half the length of unit--approximately 100 feet and tolerated well. Patient states he has not yet passed gas; is ordering solid food for supper.

## 2018-09-10 ENCOUNTER — Observation Stay (HOSPITAL_COMMUNITY): Payer: 59

## 2018-09-10 ENCOUNTER — Inpatient Hospital Stay (HOSPITAL_COMMUNITY): Payer: 59

## 2018-09-10 DIAGNOSIS — Y838 Other surgical procedures as the cause of abnormal reaction of the patient, or of later complication, without mention of misadventure at the time of the procedure: Secondary | ICD-10-CM | POA: Diagnosis not present

## 2018-09-10 DIAGNOSIS — Z781 Physical restraint status: Secondary | ICD-10-CM | POA: Diagnosis not present

## 2018-09-10 DIAGNOSIS — I2602 Saddle embolus of pulmonary artery with acute cor pulmonale: Secondary | ICD-10-CM | POA: Diagnosis not present

## 2018-09-10 DIAGNOSIS — R55 Syncope and collapse: Secondary | ICD-10-CM | POA: Diagnosis not present

## 2018-09-10 DIAGNOSIS — R739 Hyperglycemia, unspecified: Secondary | ICD-10-CM | POA: Diagnosis present

## 2018-09-10 DIAGNOSIS — R578 Other shock: Secondary | ICD-10-CM | POA: Diagnosis not present

## 2018-09-10 DIAGNOSIS — I469 Cardiac arrest, cause unspecified: Secondary | ICD-10-CM | POA: Diagnosis not present

## 2018-09-10 DIAGNOSIS — Z66 Do not resuscitate: Secondary | ICD-10-CM | POA: Diagnosis present

## 2018-09-10 DIAGNOSIS — I82409 Acute embolism and thrombosis of unspecified deep veins of unspecified lower extremity: Secondary | ICD-10-CM

## 2018-09-10 DIAGNOSIS — I10 Essential (primary) hypertension: Secondary | ICD-10-CM | POA: Diagnosis present

## 2018-09-10 DIAGNOSIS — D62 Acute posthemorrhagic anemia: Secondary | ICD-10-CM | POA: Diagnosis not present

## 2018-09-10 DIAGNOSIS — E875 Hyperkalemia: Secondary | ICD-10-CM | POA: Diagnosis present

## 2018-09-10 DIAGNOSIS — I2699 Other pulmonary embolism without acute cor pulmonale: Secondary | ICD-10-CM | POA: Diagnosis not present

## 2018-09-10 DIAGNOSIS — Z79899 Other long term (current) drug therapy: Secondary | ICD-10-CM | POA: Diagnosis not present

## 2018-09-10 DIAGNOSIS — G9341 Metabolic encephalopathy: Secondary | ICD-10-CM | POA: Diagnosis present

## 2018-09-10 DIAGNOSIS — I97121 Postprocedural cardiac arrest following other surgery: Secondary | ICD-10-CM | POA: Diagnosis not present

## 2018-09-10 DIAGNOSIS — J9601 Acute respiratory failure with hypoxia: Secondary | ICD-10-CM | POA: Diagnosis not present

## 2018-09-10 DIAGNOSIS — E872 Acidosis: Secondary | ICD-10-CM | POA: Diagnosis not present

## 2018-09-10 DIAGNOSIS — Z1159 Encounter for screening for other viral diseases: Secondary | ICD-10-CM | POA: Diagnosis not present

## 2018-09-10 DIAGNOSIS — R000 Tachycardia, unspecified: Secondary | ICD-10-CM | POA: Diagnosis not present

## 2018-09-10 DIAGNOSIS — N170 Acute kidney failure with tubular necrosis: Secondary | ICD-10-CM | POA: Diagnosis not present

## 2018-09-10 DIAGNOSIS — D689 Coagulation defect, unspecified: Secondary | ICD-10-CM | POA: Diagnosis not present

## 2018-09-10 DIAGNOSIS — R579 Shock, unspecified: Secondary | ICD-10-CM | POA: Diagnosis not present

## 2018-09-10 DIAGNOSIS — N179 Acute kidney failure, unspecified: Secondary | ICD-10-CM | POA: Diagnosis not present

## 2018-09-10 DIAGNOSIS — C61 Malignant neoplasm of prostate: Secondary | ICD-10-CM | POA: Diagnosis present

## 2018-09-10 LAB — COMPREHENSIVE METABOLIC PANEL
ALT: <5 U/L (ref 0–44)
ALT: 265 U/L — ABNORMAL HIGH (ref 0–44)
AST: 209 U/L — ABNORMAL HIGH (ref 15–41)
AST: 281 U/L — ABNORMAL HIGH (ref 15–41)
Albumin: 2.7 g/dL — ABNORMAL LOW (ref 3.5–5.0)
Albumin: 2.4 g/dL — ABNORMAL LOW (ref 3.5–5.0)
Alkaline Phosphatase: 67 U/L (ref 38–126)
Alkaline Phosphatase: 85 U/L (ref 38–126)
Anion gap: 21 — ABNORMAL HIGH (ref 5–15)
Anion gap: 14 (ref 5–15)
BUN: 12 mg/dL (ref 8–23)
BUN: 13 mg/dL (ref 8–23)
CO2: 12 mmol/L — ABNORMAL LOW (ref 22–32)
CO2: 14 mmol/L — ABNORMAL LOW (ref 22–32)
Calcium: 7.9 mg/dL — ABNORMAL LOW (ref 8.9–10.3)
Calcium: 6.4 mg/dL — CL (ref 8.9–10.3)
Chloride: 109 mmol/L (ref 98–111)
Chloride: 108 mmol/L (ref 98–111)
Creatinine, Ser: 1.7 mg/dL — ABNORMAL HIGH (ref 0.61–1.24)
Creatinine, Ser: 1.74 mg/dL — ABNORMAL HIGH (ref 0.61–1.24)
GFR calc Af Amer: 49 mL/min — ABNORMAL LOW (ref >60)
GFR calc Af Amer: 47 mL/min — ABNORMAL LOW (ref >60)
GFR calc non Af Amer: 42 mL/min — ABNORMAL LOW (ref >60)
GFR calc non Af Amer: 41 mL/min — ABNORMAL LOW (ref >60)
Glucose, Bld: 332 mg/dL — ABNORMAL HIGH (ref 70–99)
Glucose, Bld: 465 mg/dL — ABNORMAL HIGH (ref 70–99)
Potassium: 3.7 mmol/L (ref 3.5–5.1)
Potassium: 2.7 mmol/L — CL (ref 3.5–5.1)
Sodium: 142 mmol/L (ref 135–145)
Sodium: 136 mmol/L (ref 135–145)
Total Bilirubin: 0.5 mg/dL (ref 0.3–1.2)
Total Bilirubin: 0.7 mg/dL (ref 0.3–1.2)
Total Protein: 5.3 g/dL — ABNORMAL LOW (ref 6.5–8.1)
Total Protein: 4.7 g/dL — ABNORMAL LOW (ref 6.5–8.1)

## 2018-09-10 LAB — APTT
aPTT: 47 seconds — ABNORMAL HIGH (ref 24–36)
aPTT: 54 seconds — ABNORMAL HIGH (ref 24–36)

## 2018-09-10 LAB — CBC
HCT: 20.4 % — ABNORMAL LOW (ref 39.0–52.0)
HCT: 39.6 % (ref 39.0–52.0)
Hemoglobin: 5.9 g/dL — CL (ref 13.0–17.0)
Hemoglobin: 11.7 g/dL — ABNORMAL LOW (ref 13.0–17.0)
MCH: 30.4 pg (ref 26.0–34.0)
MCH: 29.8 pg (ref 26.0–34.0)
MCHC: 28.9 g/dL — ABNORMAL LOW (ref 30.0–36.0)
MCHC: 29.5 g/dL — ABNORMAL LOW (ref 30.0–36.0)
MCV: 105.2 fL — ABNORMAL HIGH (ref 80.0–100.0)
MCV: 100.8 fL — ABNORMAL HIGH (ref 80.0–100.0)
Platelets: 78 10*3/uL — ABNORMAL LOW (ref 150–400)
Platelets: 128 10*3/uL — ABNORMAL LOW (ref 150–400)
RBC: 1.94 MIL/uL — ABNORMAL LOW (ref 4.22–5.81)
RBC: 3.93 MIL/uL — ABNORMAL LOW (ref 4.22–5.81)
RDW: 13.2 % (ref 11.5–15.5)
RDW: 13 % (ref 11.5–15.5)
WBC: 7.4 10*3/uL (ref 4.0–10.5)
WBC: 14.2 10*3/uL — ABNORMAL HIGH (ref 4.0–10.5)
nRBC: 0.4 % — ABNORMAL HIGH (ref 0.0–0.2)
nRBC: 0.1 % (ref 0.0–0.2)

## 2018-09-10 LAB — HEPARIN LEVEL (UNFRACTIONATED): Heparin Unfractionated: 0.43 IU/mL (ref 0.30–0.70)

## 2018-09-10 LAB — BASIC METABOLIC PANEL
BUN: 5 mg/dL — ABNORMAL LOW (ref 8–23)
CO2: <7 mmol/L — ABNORMAL LOW (ref 22–32)
Calcium: <4 mg/dL — CL (ref 8.9–10.3)
Chloride: >130 mmol/L (ref 98–111)
Creatinine, Ser: 0.41 mg/dL — ABNORMAL LOW (ref 0.61–1.24)
GFR calc Af Amer: >60 mL/min (ref >60)
GFR calc non Af Amer: >60 mL/min (ref >60)
Glucose, Bld: 105 mg/dL — ABNORMAL HIGH (ref 70–99)
Potassium: <2 mmol/L — CL (ref 3.5–5.1)
Sodium: 147 mmol/L — ABNORMAL HIGH (ref 135–145)

## 2018-09-10 LAB — BLOOD GAS, ARTERIAL
Acid-base deficit: 13.7 mmol/L — ABNORMAL HIGH (ref 0.0–2.0)
Bicarbonate: 13 mmol/L — ABNORMAL LOW (ref 20.0–28.0)
Drawn by: 331471
FIO2: 100
MECHVT: 620 mL
O2 Saturation: 98.7 %
PEEP: 5 cmH2O
Patient temperature: 98.6
RATE: 24 resp/min
pCO2 arterial: 34.3 mmHg (ref 32.0–48.0)
pH, Arterial: 7.205 — ABNORMAL LOW (ref 7.350–7.450)
pO2, Arterial: 178 mmHg — ABNORMAL HIGH (ref 83.0–108.0)

## 2018-09-10 LAB — PROTIME-INR
INR: 2.3 — ABNORMAL HIGH (ref 0.8–1.2)
Prothrombin Time: 24.6 seconds — ABNORMAL HIGH (ref 11.4–15.2)

## 2018-09-10 LAB — GLUCOSE, CAPILLARY: Glucose-Capillary: 347 mg/dL — ABNORMAL HIGH (ref 70–99)

## 2018-09-10 LAB — SURGICAL PCR SCREEN
MRSA, PCR: NEGATIVE
Staphylococcus aureus: NEGATIVE

## 2018-09-10 LAB — ECHOCARDIOGRAM COMPLETE
Height: 72 in
Weight: 3742.53 oz

## 2018-09-10 MED ORDER — FENTANYL BOLUS VIA INFUSION
50.0000 ug | INTRAVENOUS | Status: DC | PRN
  Filled 2018-09-10: qty 50

## 2018-09-10 MED ORDER — PERFLUTREN LIPID MICROSPHERE
1.0000 mL | INTRAVENOUS | Status: AC | PRN
  Administered 2018-09-10: 2 mL via INTRAVENOUS
  Filled 2018-09-10: qty 10

## 2018-09-10 MED ORDER — ACETAMINOPHEN 650 MG RE SUPP: 650.0000 mg | RECTAL | Status: DC | PRN

## 2018-09-10 MED ORDER — VASOPRESSIN 20 UNIT/ML IV SOLN
0.0300 [IU]/min | INTRAVENOUS | Status: DC
  Administered 2018-09-10 – 2018-09-12 (×2): 0.03 [IU]/min via INTRAVENOUS
  Filled 2018-09-10 (×3): qty 2

## 2018-09-10 MED ORDER — SODIUM CHLORIDE 0.9 % IV BOLUS
1000.0000 mL | Freq: Once | INTRAVENOUS | Status: AC
  Administered 2018-09-10: 1000 mL via INTRAVENOUS

## 2018-09-10 MED ORDER — CALCIUM GLUCONATE-NACL 1-0.675 GM/50ML-% IV SOLN
1.0000 g | Freq: Once | INTRAVENOUS | Status: AC
  Administered 2018-09-10: 1000 mg via INTRAVENOUS
  Filled 2018-09-10: qty 50

## 2018-09-10 MED ORDER — EPINEPHRINE PF 1 MG/ML IJ SOLN
0.5000 ug/min | INTRAVENOUS | Status: DC
  Administered 2018-09-10: 20 ug/min via INTRAVENOUS
  Administered 2018-09-10: 0.5 ug/min via INTRAVENOUS
  Administered 2018-09-10 – 2018-09-11 (×6): 20 ug/min via INTRAVENOUS
  Filled 2018-09-10 (×12): qty 4

## 2018-09-10 MED ORDER — MUPIROCIN 2 % EX OINT
1.0000 "application " | TOPICAL_OINTMENT | Freq: Two times a day (BID) | CUTANEOUS | Status: DC
  Administered 2018-09-10 – 2018-09-11 (×3): 1 via NASAL
  Filled 2018-09-10: qty 22

## 2018-09-10 MED ORDER — MIDAZOLAM HCL 2 MG/2ML IJ SOLN
2.0000 mg | INTRAMUSCULAR | Status: DC | PRN
  Filled 2018-09-10: qty 2

## 2018-09-10 MED ORDER — FENTANYL CITRATE (PF) 100 MCG/2ML IJ SOLN
50.0000 ug | INTRAMUSCULAR | Status: AC | PRN
  Administered 2018-09-10: 150 ug via INTRAVENOUS
  Administered 2018-09-10 – 2018-09-11 (×3): 100 ug via INTRAVENOUS
  Filled 2018-09-10: qty 4
  Filled 2018-09-10: qty 2

## 2018-09-10 MED ORDER — CHLORHEXIDINE GLUCONATE 0.12% ORAL RINSE (MEDLINE KIT)
15.0000 mL | Freq: Two times a day (BID) | OROMUCOSAL | Status: DC
  Administered 2018-09-10 – 2018-09-12 (×4): 15 mL via OROMUCOSAL

## 2018-09-10 MED ORDER — INSULIN ASPART 100 UNIT/ML ~~LOC~~ SOLN
2.0000 [IU] | SUBCUTANEOUS | Status: DC
  Administered 2018-09-10: 6 [IU] via SUBCUTANEOUS

## 2018-09-10 MED ORDER — HEPARIN (PORCINE) 25000 UT/250ML-% IV SOLN
1400.0000 [IU]/h | INTRAVENOUS | Status: DC
  Administered 2018-09-10: 1400 [IU]/h via INTRAVENOUS
  Filled 2018-09-10: qty 250

## 2018-09-10 MED ORDER — ACETAMINOPHEN 325 MG PO TABS: 650.0000 mg | ORAL_TABLET | Freq: Four times a day (QID) | ORAL | Status: DC | PRN

## 2018-09-10 MED ORDER — SODIUM CHLORIDE 0.9 % IV SOLN
INTRAVENOUS | Status: DC
  Administered 2018-09-10 – 2018-09-11 (×2): via INTRAVENOUS

## 2018-09-10 MED ORDER — HYDROCORTISONE NA SUCCINATE PF 100 MG IJ SOLR
100.0000 mg | Freq: Three times a day (TID) | INTRAMUSCULAR | Status: DC
  Administered 2018-09-10 – 2018-09-11 (×2): 100 mg via INTRAVENOUS
  Filled 2018-09-10 (×2): qty 2

## 2018-09-10 MED ORDER — SODIUM CHLORIDE 0.9 % IV SOLN: 250.0000 mL | INTRAVENOUS | Status: DC

## 2018-09-10 MED ORDER — PANTOPRAZOLE SODIUM 40 MG IV SOLR
40.0000 mg | INTRAVENOUS | Status: DC
  Administered 2018-09-10 – 2018-09-11 (×2): 40 mg via INTRAVENOUS
  Filled 2018-09-10 (×2): qty 40

## 2018-09-10 MED ORDER — NOREPINEPHRINE 4 MG/250ML-% IV SOLN
2.0000 ug/min | INTRAVENOUS | Status: DC
  Administered 2018-09-10: 2 ug/min via INTRAVENOUS
  Filled 2018-09-10: qty 250

## 2018-09-10 MED ORDER — POTASSIUM CHLORIDE 10 MEQ/100ML IV SOLN
10.0000 meq | INTRAVENOUS | Status: AC
  Administered 2018-09-10 (×3): 10 meq via INTRAVENOUS
  Filled 2018-09-10 (×3): qty 100

## 2018-09-10 MED ORDER — SODIUM CHLORIDE 0.9 % IV SOLN
50.0000 ug/h | INTRAVENOUS | Status: DC
  Administered 2018-09-10: 16:00:00 50 ug/h via INTRAVENOUS
  Filled 2018-09-10: qty 50

## 2018-09-10 MED ORDER — SODIUM CHLORIDE 0.9 % IV SOLN: INTRAVENOUS | Status: DC

## 2018-09-10 MED ORDER — SODIUM BICARBONATE 8.4 % IV SOLN
INTRAVENOUS | Status: DC
  Administered 2018-09-10 – 2018-09-12 (×5): via INTRAVENOUS
  Filled 2018-09-10 (×6): qty 150

## 2018-09-10 MED ORDER — ORAL CARE MOUTH RINSE
15.0000 mL | OROMUCOSAL | Status: DC
  Administered 2018-09-10 – 2018-09-12 (×16): 15 mL via OROMUCOSAL

## 2018-09-10 MED ORDER — TENECTEPLASE 50 MG IV KIT
50.0000 mg | PACK | INTRAVENOUS | Status: AC
  Administered 2018-09-10: 50 mg via INTRAVENOUS
  Filled 2018-09-10: qty 10

## 2018-09-10 MED FILL — Medication: Qty: 1 | Status: AC

## 2018-09-10 NOTE — Progress Notes (Signed)
Woke and following some commands.  Grabbing for tube.  Will have wrist restraints placed and judiciously use sedation.  [name], MD [facility] Pulmonary/Critical Care 09/10/2018, 11:07 AM

## 2018-09-10 NOTE — Discharge Summary (Signed)
Date of admission: 08/29/2018  Date of discharge: 09/10/2018  Admission diagnosis: prostate cancer  Discharge diagnosis: Same  Procedures:  RALP w/ bilateral pelvic lymph node dissection   History and Physical: For full details, please see admission history and physical. Briefly, Kevin Potts is a 63 y.o. year old patient with moderate risk prostate cancer and is s/p RALP on 08/18/2018 with Dr. Tresa Moore.   Hospital Course: The patient was monitored on the floor following surgery and had a routine post-op course.  By POD2, the patient was tolerating a regular diet, passing flatus and ambulating w/o difficulty.    Physical Exam:  General: Alert and oriented CV: RRR, palpable distal pulses Lungs: CTAB, equal chest rise Abdomen: Soft, NTND, no rebound or guarding Incisions: c/d/i GU:  Foley draining clear-yellow urine Ext: NT, No erythema  Laboratory values:  Recent Labs    09/07/2018 1025 09/09/18 0556  HGB 13.3 12.3*  HCT 40.1 38.7*   Recent Labs    09/09/18 0556  CREATININE 1.04    Disposition: Home  Discharge instruction: The patient was instructed to be ambulatory but told to refrain from heavy lifting, strenuous activity, or driving.  Discharge medications:  Allergies as of 09/10/2018   No Known Allergies     Medication List    STOP taking these medications   multivitamin with minerals tablet   vitamin C 500 MG tablet Commonly known as:  ASCORBIC ACID     TAKE these medications   Benefiber Drink Mix Pack Take 4 g by mouth at bedtime.   HYDROcodone-acetaminophen 5-325 MG tablet Commonly known as:  Norco Take 1-2 tablets by mouth every 6 (six) hours as needed for moderate pain or severe pain.   sulfamethoxazole-trimethoprim 800-160 MG tablet Commonly known as:  BACTRIM DS Take 1 tablet by mouth 2 (two) times daily. Start the day prior to foley removal appointment   SUMAtriptan 100 MG tablet Commonly known as:  IMITREX Take 100 mg by mouth every 2 (two) hours  as needed for migraine. May repeat in 2 hours if headache persists or recurs.   topiramate 100 MG tablet Commonly known as:  TOPAMAX Take 100 mg by mouth daily.       Followup:  Follow-up Information    Alexis Frock, MD On 09/18/2018.   Specialty:  Urology Why:  at 11:15 for MD visit, pathology reveiw, and catheter removal Contact information: [place] [address] [phone]

## 2018-09-10 NOTE — Progress Notes (Signed)
CMP Latest Ref Rng & Units 09/10/2018 09/09/2018 08/28/2018  Glucose 70 - 99 mg/dL 105(H) 132(H) 103(H)  BUN 8 - 23 mg/dL 5(L) 10 14  Creatinine 0.61 - 1.24 mg/dL 0.41(L) 1.04 1.04  Sodium 135 - 145 mmol/L 147(H) 139 141  Potassium 3.5 - 5.1 mmol/L <2.0(LL) 4.0 4.2  Chloride 98 - 111 mmol/L >130(HH) 112(H) 110  CO2 22 - 32 mmol/L <7(L) 22 24  Calcium 8.9 - 10.3 mg/dL <4.0(LL) 8.4(L) 9.0    Recent labs might be erroneous.  Will repeat.  [name], MD [facility] Pulmonary/Critical Care 09/10/2018, 11:20 AM

## 2018-09-10 NOTE — Progress Notes (Signed)
2 Days Post-Op Subjective: A code blue was called on the patient after he was found to be diaphoretic, dyspneic and then eventually lost consciousness.  Per the nursing staff, the patient was getting dressed to go home when his symptoms suddenly arose.  Upon further evaluation, the patient was found to be in PEA and CPR was administered for approximately 30 minutes until he had return of spontaneous circulation.  He has no prior history of DVT and/or PE, per the patient's wife.  Currently, he is in the intensive care unit, intubated.  He is opening his eyes and following commands.  Objective: Vital signs in last 24 hours: Temp:  [98.8 F (37.1 C)-99.7 F (37.6 C)] 99.7 F (37.6 C) (05/31 0445) Pulse Rate:  [81-88] 85 (05/31 0445) Resp:  [18-20] 20 (05/31 0445) BP: (119-122)/(79-83) 119/83 (05/31 0445) SpO2:  [97 %-98 %] 97 % (05/31 0445) FiO2 (%):  [100 %] 100 % (05/31 1053)  Intake/Output from previous day: 05/30 0701 - 05/31 0700 In: 1699.7 [I.V.:1699.7] Out: 2800 [Urine:2800]  Intake/Output this shift: Total I/O In: -  Out: 750 [Urine:750]  Physical Exam:  General: Intubated.  Opening his eyes and following commands CV: Tachycardic, palpable distal pulses Lungs:  equal chest rise Abdomen: Soft, NTND, no rebound or guarding Incisions: Clean, dry and intact Gu: Foley catheter in place and draining clear-yellow urine Ext: NT, No erythema  Lab Results: Recent Labs    09/02/2018 1025 09/09/18 0556  HGB 13.3 12.3*  HCT 40.1 38.7*   BMET Recent Labs    09/09/18 0556 09/10/18 1027  NA 139 147*  K 4.0 <2.0*  CL 112* >130*  CO2 22 <7*  GLUCOSE 132* 105*  BUN 10 5*  CREATININE 1.04 0.41*  CALCIUM 8.4* <4.0*     Studies/Results: Dg Chest Port 1 View  Result Date: 09/10/2018 CLINICAL DATA:  Status post intubation. EXAM: PORTABLE CHEST 1 VIEW COMPARISON:  None. FINDINGS: Endotracheal tube is in place with the tip in good position at the level of the clavicular heads,  5 cm above the carina. Lungs are clear. Heart size is normal. No pneumothorax or pleural fluid. Defibrillator pads noted. IMPRESSION: ETT in good position.  Lungs are clear. Electronically Signed   By: Inge Rise M.D.   On: 09/10/2018 11:07    Assessment/Plan: Postop day 2 status post robot-assisted laparoscopic prostatectomy with bilateral pelvic lymph node dissection.  Currently intubated after a syncopal episode with eventual PEA.  -Further cardiopulmonary evaluation pending.  He received  an empiric thrombotic in case pulmonary embolism is the source of his current status.  Critical care medicine on board -Family updated -Will continue to closely monitor   LOS: 0 days   Kevin Hughs, MD [facility] Pager: [phone]  09/10/2018, 11:18 AM

## 2018-09-10 NOTE — Progress Notes (Signed)
ANTICOAGULATION CONSULT NOTE -  Consult  Pharmacy Consult for IV heparin (note received TNKase ~1030) Indication: pulmonary embolus  No Known Allergies  Patient Measurements: Height: 6' (182.9 cm) Weight: 233 lb 14.5 oz (106.1 kg) IBW/kg (Calculated) : 77.6 Heparin Dosing Weight: 99.7  Vital Signs: Temp: 97.9 F (36.6 C) (05/31 2000) Temp Source: Axillary (05/31 2000) BP: 94/51 (05/31 2258) Pulse Rate: 104 (05/31 2258)  Labs: Recent Labs    09/09/18 0556 09/10/18 1018 09/10/18 1027 09/10/18 1056 09/10/18 1227 09/10/18 1413 09/10/18 2157  HGB 12.3* 5.9*  --  11.7*  --   --   --   HCT 38.7* 20.4*  --  39.6  --   --   --   PLT  --  78*  --  128*  --   --   --   APTT  --  47*  --   --   --  54*  --   LABPROT  --  24.6*  --   --   --   --   --   INR  --  2.3*  --   --   --   --   --   HEPARINUNFRC  --   --   --   --   --   --  0.43  CREATININE 1.04  --  0.41* 1.70* 1.74*  --   --     Estimated Creatinine Clearance: 54.7 mL/min (A) (by C-G formula based on SCr of 1.74 mg/dL (H)).   Medical History: Past Medical History:  Diagnosis Date  . Diverticulitis   . Migraines     Medications:  Scheduled:  . chlorhexidine gluconate (MEDLINE KIT)  15 mL Mouth Rinse BID  . hydrocortisone sod succinate (SOLU-CORTEF) inj  100 mg Intravenous Q8H  . insulin aspart  2-6 Units Subcutaneous Q4H  . mouth rinse  15 mL Mouth Rinse 10 times per day  . mupirocin ointment  1 application Nasal BID  . pantoprazole (PROTONIX) IV  40 mg Intravenous Q24H   Infusions:  . sodium chloride 75 mL/hr at 09/10/18 2237  . sodium chloride Stopped (09/10/18 2207)  . epinephrine 20 mcg/min (09/10/18 2237)  . fentaNYL infusion INTRAVENOUS 100 mcg/hr (09/10/18 2237)  . heparin 1,400 Units/hr (09/10/18 2237)  . norepinephrine (LEVOPHED) Adult infusion 2 mcg/min (09/10/18 2304)  .  sodium bicarbonate  infusion 1000 mL 50 mL/hr at 09/10/18 2237  . vasopressin (PITRESSIN) infusion - *FOR SHOCK* 0.03  Units/min (09/10/18 2237)    Assessment: 63 yo male POD2 robot-assisted prostectomy with plan to be discharged today found to be diaphoretic, lost consciousness and went into PEA presumable from a PE. Orders received to give TNKase and dose of 50m per patient's weight of >90kg administered per guidelines around 1030am. Patient subsequently transferred to ICU for further management. APTT is 54 sec and IV heparin can safely be started for treatment of presumed PE with guidelines stated that IV heparin cannot be administered until aPTT<80s.  Today, 5/31 2220 HL = 0.43 at goal, no infusion or bleeding issues reported by RN   Goal of Therapy:  Heparin level 0.3-0.5 for first 24hrs s/p TNKase then can resume normal goal of 0.3-0.7 Monitor platelets by anticoagulation protocol: Yes   Plan:  1) Continue heparin drip at 1400 units/hr 2) Daily heparin level and CBC   GDorrene German5/31/2020 11:13 PM

## 2018-09-10 NOTE — Progress Notes (Signed)
  Echocardiogram 2D Echocardiogram has been performed.  Amaryllis Malmquist G Marcee Jacobs 09/10/2018, 12:07 PM

## 2018-09-10 NOTE — Consult Note (Signed)
NAME:  Kevin Potts, MRN:  [id number], DOB:  03-Nov-1955, LOS: 0 ADMISSION DATE:  09/07/2018, CONSULTATION DATE:  09/10/2018 REFERRING MD:  Dr. Lovena Neighbours, Urology, CHIEF COMPLAINT:  Cardiac arrest   Brief History   63 yo male admitted for prostatectomy on 5/29, and robotic assisted laparoscopic radical prostatectomy with b/l pelvic laminectomy.  Was to be discharged home 5/31 when he developed sudden onset of diaphoresis, dyspnea, and then loss of consciousness.  Found to have PEA.  Empirically treated with thrombolytic.  Had ROSC after about 30 minutes.  Past Medical History  Hypertension, Migraine HA, Diverticulosis  Significant Hospital Events   5/29 Admit 5/31 Cardiac arrest, TNK given  Consults:  Urology  Procedures:  ETT 5/31 >>   Significant Diagnostic Tests:    Micro Data:    Antimicrobials:     Interim history/subjective:    Objective   Blood pressure 119/83, pulse 85, temperature 99.7 F (37.6 C), temperature source Oral, resp. rate 20, height 6' (1.829 m), weight 106.1 kg, SpO2 97 %.        Intake/Output Summary (Last 24 hours) at 09/10/2018 1050 Last data filed at 09/10/2018 3220 Gross per 24 hour  Intake 1699.68 ml  Output 3550 ml  Net -1850.32 ml   Filed Weights   08/30/2018 0611 08/17/2018 1200  Weight: 103 kg 106.1 kg    Examination:  General - transferred to ICU Eyes - pupils dilated ENT - ETT in place Cardiac - irregular, tachycardic Chest - scattered rhonchi Abdomen - soft, non tender, decreased bowel sounds Extremities - right leg swelling compared to left Skin - dusky extremities Neuro - unresponsive  Resolved Hospital Problem list     Assessment & Plan:   PEA cardiac arrest presumptively from acute pulmonary embolism s/p empiric thrombolytic therapy 5/31. Hx of HTN. Plan - f/u Echo, doppler legs - not stable enough for transport to CT imaging - epinephrine gtt to keep MAP > 65 - continue IV fluids - add heparin gtt per pharmacy  Acute hypoxic respiratory failure. Plan - full vent support - f/u CXR, ABG  S/p prostatectomy. Plan - post op care per urology  Acute metabolic encephalopathy. Hx of migraine headaches. Plan - RASS goal 0 - hold outpt topamax  Best practice:  Diet: NPO DVT prophylaxis: heparin gtt GI prophylaxis: Protonix Mobility: bed rest Code Status: full code Family Communication: updated pt's wife at bedside Disposition: ICU  Labs   CBC: Recent Labs  Lab 08/20/2018 1025 09/09/18 0556  HGB 13.3 12.3*  HCT 40.1 38.7*    Basic Metabolic Panel: Recent Labs  Lab 09/09/18 0556  NA 139  K 4.0  CL 112*  CO2 22  GLUCOSE 132*  BUN 10  CREATININE 1.04  CALCIUM 8.4*   GFR: Estimated Creatinine Clearance: 91.5 mL/min (by C-G formula based on SCr of 1.04 mg/dL). No results for input(s): PROCALCITON, WBC, LATICACIDVEN in the last 168 hours.  Liver Function Tests: No results for input(s): AST, ALT, ALKPHOS, BILITOT, PROT, ALBUMIN in the last 168 hours. No results for input(s): LIPASE, AMYLASE in the last 168 hours. No results for input(s): AMMONIA in the last 168 hours.  ABG No results found for: PHART, PCO2ART, PO2ART, HCO3, TCO2, ACIDBASEDEF, O2SAT   Coagulation Profile: No results for input(s): INR, PROTIME in the last 168 hours.  Cardiac Enzymes: No results for input(s): CKTOTAL, CKMB, CKMBINDEX, TROPONINI in the last 168 hours.  HbA1C: No results found for: HGBA1C  CBG: No results for input(s): GLUCAP in the last  168 hours.  Review of Systems:   Unable to obtain  Past Medical History  He,  has a past medical history of Diverticulitis and Migraines.   Surgical History    Past Surgical History:  Procedure Laterality Date  . COLONOSCOPY  2009  . COLONOSCOPY N/A 11/28/2017   Procedure: COLONOSCOPY;  Surgeon: Rogene Houston, MD;  Location: [facility];  Service: Endoscopy;  Laterality: N/A;  9:15  . LYMPHADENECTOMY Bilateral 08/15/2018   Procedure:  LYMPHADENECTOMY;  Surgeon: Alexis Frock, MD;  Location: [facility];  Service: Urology;  Laterality: Bilateral;  . POLYPECTOMY  11/28/2017   Procedure: POLYPECTOMY;  Surgeon: Rogene Houston, MD;  Location: [facility];  Service: Endoscopy;;  hepatic flexure polyp  . ROBOT ASSISTED LAPAROSCOPIC RADICAL PROSTATECTOMY N/A 08/16/2018   Procedure: XI ROBOTIC ASSISTED LAPAROSCOPIC RADICAL PROSTATECTOMY;  Surgeon: Alexis Frock, MD;  Location: [facility];  Service: Urology;  Laterality: N/A;  3 HRS     Social History   reports that he has never smoked. He has never used smokeless tobacco. He reports current alcohol use. He reports that he does not use drugs.   Family History   Unable to obtain    Allergies No Known Allergies   Home Medications  Prior to Admission medications   Medication Sig Start Date End Date Taking? Authorizing Provider  Multiple Vitamins-Minerals (MULTIVITAMIN WITH MINERALS) tablet Take 1 tablet by mouth daily.   Yes [provider]  SUMAtriptan (IMITREX) 100 MG tablet Take 100 mg by mouth every 2 (two) hours as needed for migraine. May repeat in 2 hours if headache persists or recurs.   Yes [provider]  topiramate (TOPAMAX) 100 MG tablet Take 100 mg by mouth daily.   Yes [provider]  vitamin C (ASCORBIC ACID) 500 MG tablet Take 500 mg by mouth daily.   Yes [provider]  HYDROcodone-acetaminophen (NORCO) 5-325 MG tablet Take 1-2 tablets by mouth every 6 (six) hours as needed for moderate pain or severe pain. 09/05/2018   Debbrah Alar, PA-C  sulfamethoxazole-trimethoprim (BACTRIM DS) 800-160 MG tablet Take 1 tablet by mouth 2 (two) times daily. Start the day prior to foley removal appointment 08/26/2018   Debbrah Alar, PA-C  Wheat Dextrin (BENEFIBER DRINK MIX) PACK Take 4 g by mouth at bedtime. Patient not taking: Reported on 08/21/2018 11/28/17   Rogene Houston, MD     Critical care time: 1 minutes    [name], MD [place] 09/10/2018, 10:59 AM

## 2018-09-10 NOTE — Progress Notes (Signed)
ANTICOAGULATION CONSULT NOTE - Initial Consult  Pharmacy Consult for IV heparin (note received TNKase ~1030) Indication: pulmonary embolus  No Known Allergies  Patient Measurements: Height: 6' (182.9 cm) Weight: 233 lb 14.5 oz (106.1 kg) IBW/kg (Calculated) : 77.6 Heparin Dosing Weight: 99.7  Vital Signs: Temp: 99.7 F (37.6 C) (05/31 0445) Temp Source: Oral (05/31 0445) BP: 119/83 (05/31 0445) Pulse Rate: 127 (05/31 1100)  Labs: Recent Labs    09/09/18 0556 09/10/18 1018 09/10/18 1027 09/10/18 1056 09/10/18 1227 09/10/18 1413  HGB 12.3* 5.9*  --  11.7*  --   --   HCT 38.7* 20.4*  --  39.6  --   --   PLT  --  78*  --  128*  --   --   APTT  --  47*  --   --   --  54*  LABPROT  --  24.6*  --   --   --   --   INR  --  2.3*  --   --   --   --   CREATININE 1.04  --  0.41* 1.70* 1.74*  --     Estimated Creatinine Clearance: 54.7 mL/min (A) (by C-G formula based on SCr of 1.74 mg/dL (H)).   Medical History: Past Medical History:  Diagnosis Date  . Diverticulitis   . Migraines     Medications:  Scheduled:  . chlorhexidine gluconate (MEDLINE KIT)  15 mL Mouth Rinse BID  . mouth rinse  15 mL Mouth Rinse 10 times per day  . mupirocin ointment  1 application Nasal BID  . pantoprazole (PROTONIX) IV  40 mg Intravenous Q24H   Infusions:  . sodium chloride 75 mL/hr at 09/10/18 1251  . epinephrine 20 mcg/min (09/10/18 1512)  .  sodium bicarbonate  infusion 1000 mL 50 mL/hr at 09/10/18 1313  . vasopressin (PITRESSIN) infusion - *FOR SHOCK* 0.03 Units/min (09/10/18 1242)    Assessment: 63 yo male POD2 robot-assisted prostectomy with plan to be discharged today found to be diaphoretic, lost consciousness and went into PEA presumable from a PE. Orders received to give TNKase and dose of '50mg'$  per patient's weight of >90kg administered per guidelines around 1030am. Patient subsequently transferred to ICU for further management. APTT is 54 sec and IV heparin can safely be  started for treatment of presumed PE with guidelines stated that IV heparin cannot be administered until aPTT<80s.   Goal of Therapy:  Heparin level 0.3-0.5 for first 24hrs s/p TNKase then can resume normal goal of 0.3-0.7 Monitor platelets by anticoagulation protocol: Yes   Plan:  1) Per protocol, start IV heparin at 14 units/kg/hr due to patient being <=75yo - start 1400 units/hr with NO bolus 2) Check heparin level with reduced goal as above 6 hours after start of heparin 3) Daily heparin level and CBC   Adrian Saran, PharmD, BCPS 09/10/2018 3:27 PM

## 2018-09-10 NOTE — Progress Notes (Addendum)
eLink Physician-Brief Progress Note Patient Name: Kevin Potts DOB: March 01, 1956 MRN: [id number]   Date of Service  09/10/2018  HPI/Events of Note  63 year old male admitted to the ICU status post cardiac arrest present really from pulmonary embolism status post thrombolytic therapy on 09/10/2018  eICU Interventions  Hypotensive and maxed out on epinephrine and vasopressin.  Ordered Levophed 2-10 MCG per minute which can be given through the peripheral line as we could not get a central line because of within 24 hours of TPA.  Maintain map more than 65 or systolic more than 90, either or.  Ordered hydrocortisone.  Ordered UA and blood culture.      Intervention Category Major Interventions: Shock - evaluation and management Intermediate Interventions: Medication change / dose adjustment Evaluation Type: Other  Ephriam Jenkins Mohogany Toppins 09/10/2018, 9:12 PM   2:57 AM Patient developed hypotension and had to increase Levophed to 20 mcg per minute through peripheral line.  TPA was stopped around 10 AM on 09/10/2018.  Could not rule out intracerebral bleed.  Notified Dr. Mariane Masters for bedside evaluation and line placement.  Ordered stat CBC, BMP and coags.   4:41 AM Dr. Mariane Masters placed the central line.  Changed Levophed dosing from a peripheral line to central line range (2-40).  Can resume heparin drip if the patient mentation improves and hemoglobin is stable.   6:06 AM Lactic acid more than 11 probably secondary to the hypotension when he has not had a central line. Ordered phenylephrine to already existing Levophed, vasopressin and epinephrine to strictly maintain map more than 65. Hemoglobin dropped from 11.7 to 8.3, so though the patient mentation improved, we have not started heparin drip back.  Moreover APTT more than 200.  Recommend CT head when the patient is stable enough to go down for the test. Ordered CBC and lactic acid at 8 AM.

## 2018-09-10 NOTE — Progress Notes (Signed)
  S: Events of day noted with CV collapse POD 2 after uneventful prior course for prostatectomy. Very uneventful intra-op course and no known CV history. Had routine SCD's pre-induction through PACU. Now in ICU on high dose multipressor and anticoagulated for presumed PE leading to PEA, CPE, ROSC after 58min.   O: GEN: Intubated in ICU, SBP upper 80s, HR low 100s NEURO: moving all extremities, following simple commands CV: regular sinus tach by bedside monitor. No sig LE edema / swelling. ABD: recent port sites c/d/i, No abd drains. GU: Foley with medium yellow urine, some mild scrotal edema MSK: No sig LE edema / swelling  ECHO: No mojor LV or RV abnormalities LE Duplex: No overt DVT's  A/P:  1 - Acute CV Collapse - agree with current pre-emptive PE treatment, pending additional eval.  Hgb acceptable and anticoagulation ceratinly acceptable and necessary risk. Greatly appreciate Dr. Halford Chessman and [place] team for efforts today. He would likely not be alive if event occurred outside of hospital setting. Greatly appreciate Dr. Jackson Latino input and updates as well.   2 - Prostate Cancer - path pending, abd drains out, continue foley.

## 2018-09-10 NOTE — Progress Notes (Signed)
Pt was in the process of being discharged home after POD day 2 prostatectomy. Pt's IV was removed and foley teaching was performed. As patient was getting dressed he experienced dizziness and became diaphoretic. Was placed on oxygen and eventually had to go on NRB. Pt O2 levels were in the low 80's. Vital signs BP in 80s/60's.  RN called rapid response RN while this was happening. During this time, pt coded and code blue was intitated. RN followed pt to ICU until pt became stable.

## 2018-09-10 NOTE — Progress Notes (Signed)
VASCULAR LAB PRELIMINARY  PRELIMINARY  PRELIMINARY  PRELIMINARY  Bilateral lower extremity venous duplex completed.    Preliminary report:  See CV proc for preliminary results.  Abram Sander, RVS  [facility], [place], RVT 09/10/2018, 4:10 PM

## 2018-09-10 NOTE — Progress Notes (Signed)
Pt CBG at  1959 was 347. Pt had not had his CBG done at 1600 and did not receive any insulin. This RN talked with Abby, RN in E link and discussed giving pt his max insulin of 6 units which was ordered for CBG of 201-250 instead of the ordered insulin drip. Will continue to monitor for hyperglycemic symptoms. Will recheck CBG at midnight and administered scheduled insulin to correct pt's blood glucose. If CBG does not come below 250, this RN will notify the physician and E link RN.   Luevenia Maxin, RN

## 2018-09-11 ENCOUNTER — Inpatient Hospital Stay (HOSPITAL_COMMUNITY): Payer: 59

## 2018-09-11 DIAGNOSIS — N179 Acute kidney failure, unspecified: Secondary | ICD-10-CM

## 2018-09-11 DIAGNOSIS — R579 Shock, unspecified: Secondary | ICD-10-CM

## 2018-09-11 DIAGNOSIS — J9601 Acute respiratory failure with hypoxia: Secondary | ICD-10-CM

## 2018-09-11 LAB — LACTIC ACID, PLASMA
Lactic Acid, Venous: >11 mmol/L (ref 0.5–1.9)
Lactic Acid, Venous: >11 mmol/L (ref 0.5–1.9)
Lactic Acid, Venous: >11 mmol/L (ref 0.5–1.9)

## 2018-09-11 LAB — CBC WITH DIFFERENTIAL/PLATELET
Abs Immature Granulocytes: 0.46 10*3/uL — ABNORMAL HIGH (ref 0.00–0.07)
Basophils Absolute: 0 10*3/uL (ref 0.0–0.1)
Basophils Relative: 0 %
Eosinophils Absolute: 0 10*3/uL (ref 0.0–0.5)
Eosinophils Relative: 0 %
HCT: 28.7 % — ABNORMAL LOW (ref 39.0–52.0)
Hemoglobin: 8.3 g/dL — ABNORMAL LOW (ref 13.0–17.0)
Immature Granulocytes: 3 %
Lymphocytes Relative: 10 %
Lymphs Abs: 1.8 10*3/uL (ref 0.7–4.0)
MCH: 29.4 pg (ref 26.0–34.0)
MCHC: 28.9 g/dL — ABNORMAL LOW (ref 30.0–36.0)
MCV: 101.8 fL — ABNORMAL HIGH (ref 80.0–100.0)
Monocytes Absolute: 1.4 10*3/uL — ABNORMAL HIGH (ref 0.1–1.0)
Monocytes Relative: 8 %
Neutro Abs: 14.6 10*3/uL — ABNORMAL HIGH (ref 1.7–7.7)
Neutrophils Relative %: 79 %
Platelets: 158 10*3/uL (ref 150–400)
RBC: 2.82 MIL/uL — ABNORMAL LOW (ref 4.22–5.81)
RDW: 12.7 % (ref 11.5–15.5)
WBC: 18.3 10*3/uL — ABNORMAL HIGH (ref 4.0–10.5)
nRBC: 0.5 % — ABNORMAL HIGH (ref 0.0–0.2)

## 2018-09-11 LAB — BLOOD GAS, ARTERIAL
Acid-base deficit: 24.3 mmol/L — ABNORMAL HIGH (ref 0.0–2.0)
Bicarbonate: 6.5 mmol/L — ABNORMAL LOW (ref 20.0–28.0)
Drawn by: 257701
FIO2: 60
MECHVT: 620 mL
O2 Saturation: 91.4 %
PEEP: 5 cmH2O
Patient temperature: 96.6
RATE: 24 resp/min
pCO2 arterial: 34.3 mmHg (ref 32.0–48.0)
pH, Arterial: 6.896 — CL (ref 7.350–7.450)
pO2, Arterial: 99.6 mmHg (ref 83.0–108.0)

## 2018-09-11 LAB — GLUCOSE, CAPILLARY
Glucose-Capillary: 102 mg/dL — ABNORMAL HIGH (ref 70–99)
Glucose-Capillary: 124 mg/dL — ABNORMAL HIGH (ref 70–99)
Glucose-Capillary: 139 mg/dL — ABNORMAL HIGH (ref 70–99)
Glucose-Capillary: 141 mg/dL — ABNORMAL HIGH (ref 70–99)
Glucose-Capillary: 150 mg/dL — ABNORMAL HIGH (ref 70–99)
Glucose-Capillary: 278 mg/dL — ABNORMAL HIGH (ref 70–99)
Glucose-Capillary: 318 mg/dL — ABNORMAL HIGH (ref 70–99)
Glucose-Capillary: 85 mg/dL (ref 70–99)
Glucose-Capillary: 95 mg/dL (ref 70–99)
Glucose-Capillary: 99 mg/dL (ref 70–99)

## 2018-09-11 LAB — BASIC METABOLIC PANEL
Anion gap: 23 — ABNORMAL HIGH (ref 5–15)
BUN: 26 mg/dL — ABNORMAL HIGH (ref 8–23)
CO2: 10 mmol/L — ABNORMAL LOW (ref 22–32)
Calcium: 6.9 mg/dL — ABNORMAL LOW (ref 8.9–10.3)
Chloride: 108 mmol/L (ref 98–111)
Creatinine, Ser: 4.98 mg/dL — ABNORMAL HIGH (ref 0.61–1.24)
GFR calc Af Amer: 13 mL/min — ABNORMAL LOW (ref >60)
GFR calc non Af Amer: 11 mL/min — ABNORMAL LOW (ref >60)
Glucose, Bld: 345 mg/dL — ABNORMAL HIGH (ref 70–99)
Potassium: 4.9 mmol/L (ref 3.5–5.1)
Sodium: 141 mmol/L (ref 135–145)

## 2018-09-11 LAB — PREPARE RBC (CROSSMATCH)

## 2018-09-11 LAB — URINALYSIS, ROUTINE W REFLEX MICROSCOPIC
Bilirubin Urine: NEGATIVE
Glucose, UA: >=500 mg/dL — AB
Ketones, ur: NEGATIVE mg/dL
Leukocytes,Ua: NEGATIVE
Nitrite: NEGATIVE
Protein, ur: 100 mg/dL — AB
RBC / HPF: >50 RBC/hpf — ABNORMAL HIGH (ref 0–5)
Specific Gravity, Urine: 1.015 (ref 1.005–1.030)
pH: 6 (ref 5.0–8.0)

## 2018-09-11 LAB — RENAL FUNCTION PANEL
Albumin: 1.6 g/dL — ABNORMAL LOW (ref 3.5–5.0)
Anion gap: 25 — ABNORMAL HIGH (ref 5–15)
BUN: 26 mg/dL — ABNORMAL HIGH (ref 8–23)
CO2: 13 mmol/L — ABNORMAL LOW (ref 22–32)
Calcium: 5.1 mg/dL — CL (ref 8.9–10.3)
Chloride: 101 mmol/L (ref 98–111)
Creatinine, Ser: 4.72 mg/dL — ABNORMAL HIGH (ref 0.61–1.24)
GFR calc Af Amer: 14 mL/min — ABNORMAL LOW (ref >60)
GFR calc non Af Amer: 12 mL/min — ABNORMAL LOW (ref >60)
Glucose, Bld: 177 mg/dL — ABNORMAL HIGH (ref 70–99)
Phosphorus: 16.2 mg/dL — ABNORMAL HIGH (ref 2.5–4.6)
Potassium: 5.5 mmol/L — ABNORMAL HIGH (ref 3.5–5.1)
Sodium: 139 mmol/L (ref 135–145)

## 2018-09-11 LAB — APTT: aPTT: >200 seconds (ref 24–36)

## 2018-09-11 LAB — HEPARIN LEVEL (UNFRACTIONATED): Heparin Unfractionated: 0.95 IU/mL — ABNORMAL HIGH (ref 0.30–0.70)

## 2018-09-11 LAB — CBC
HCT: 22.5 % — ABNORMAL LOW (ref 39.0–52.0)
Hemoglobin: 6.8 g/dL — CL (ref 13.0–17.0)
MCH: 30.6 pg (ref 26.0–34.0)
MCHC: 30.2 g/dL (ref 30.0–36.0)
MCV: 101.4 fL — ABNORMAL HIGH (ref 80.0–100.0)
Platelets: 134 10*3/uL — ABNORMAL LOW (ref 150–400)
RBC: 2.22 MIL/uL — ABNORMAL LOW (ref 4.22–5.81)
RDW: 12.8 % (ref 11.5–15.5)
WBC: 16.8 10*3/uL — ABNORMAL HIGH (ref 4.0–10.5)
nRBC: 0.8 % — ABNORMAL HIGH (ref 0.0–0.2)

## 2018-09-11 LAB — MAGNESIUM: Magnesium: 3.3 mg/dL — ABNORMAL HIGH (ref 1.7–2.4)

## 2018-09-11 LAB — PROTIME-INR
INR: 3.4 — ABNORMAL HIGH (ref 0.8–1.2)
Prothrombin Time: 34 seconds — ABNORMAL HIGH (ref 11.4–15.2)

## 2018-09-11 LAB — HEMOGLOBIN AND HEMATOCRIT, BLOOD
HCT: 19.8 % — ABNORMAL LOW (ref 39.0–52.0)
HCT: 21.3 % — ABNORMAL LOW (ref 39.0–52.0)
Hemoglobin: 6.1 g/dL — CL (ref 13.0–17.0)
Hemoglobin: 6.6 g/dL — CL (ref 13.0–17.0)

## 2018-09-11 LAB — CREATININE, URINE, RANDOM: Creatinine, Urine: 91.99 mg/dL

## 2018-09-11 LAB — ABO/RH: ABO/RH(D): A POS

## 2018-09-11 LAB — SODIUM, URINE, RANDOM: Sodium, Ur: 90 mmol/L

## 2018-09-11 MED ORDER — NOREPINEPHRINE 4 MG/250ML-% IV SOLN
0.0000 ug/min | INTRAVENOUS | Status: DC
  Administered 2018-09-11 (×2): 40 ug/min via INTRAVENOUS
  Filled 2018-09-11 (×3): qty 250

## 2018-09-11 MED ORDER — ACETAMINOPHEN 325 MG PO TABS: 650.0000 mg | ORAL_TABLET | Freq: Four times a day (QID) | ORAL | Status: DC | PRN

## 2018-09-11 MED ORDER — HEPARIN (PORCINE) 2000 UNITS/L FOR CRRT: INTRAVENOUS_CENTRAL | Status: DC | PRN

## 2018-09-11 MED ORDER — SODIUM BICARBONATE 8.4 % IV SOLN: 25.0000 meq | Freq: Once | INTRAVENOUS | Status: DC

## 2018-09-11 MED ORDER — HYDROCORTISONE NA SUCCINATE PF 100 MG IJ SOLR
50.0000 mg | Freq: Four times a day (QID) | INTRAMUSCULAR | Status: DC
  Administered 2018-09-11 – 2018-09-12 (×4): 50 mg via INTRAVENOUS
  Filled 2018-09-11 (×4): qty 2

## 2018-09-11 MED ORDER — SODIUM BICARBONATE 8.4 % IV SOLN
INTRAVENOUS | Status: AC
  Administered 2018-09-11: 50 meq
  Filled 2018-09-11: qty 50

## 2018-09-11 MED ORDER — INSULIN DETEMIR 100 UNIT/ML ~~LOC~~ SOLN
5.0000 [IU] | Freq: Two times a day (BID) | SUBCUTANEOUS | Status: DC
  Filled 2018-09-11: qty 0.05

## 2018-09-11 MED ORDER — NOREPINEPHRINE BITARTRATE 1 MG/ML IV SOLN
0.0000 ug/min | INTRAVENOUS | Status: DC
  Administered 2018-09-11: 40 ug/min via INTRAVENOUS
  Administered 2018-09-12 (×2): 70 ug/min via INTRAVENOUS
  Filled 2018-09-11 (×8): qty 16

## 2018-09-11 MED ORDER — INSULIN REGULAR(HUMAN) IN NACL 100-0.9 UT/100ML-% IV SOLN
INTRAVENOUS | Status: DC
  Administered 2018-09-11: 2.6 [IU]/h via INTRAVENOUS
  Filled 2018-09-11: qty 100

## 2018-09-11 MED ORDER — VANCOMYCIN HCL 10 G IV SOLR
2000.0000 mg | Freq: Once | INTRAVENOUS | Status: AC
  Administered 2018-09-11: 2000 mg via INTRAVENOUS
  Filled 2018-09-11: qty 2000

## 2018-09-11 MED ORDER — STERILE WATER FOR INJECTION IV SOLN
INTRAVENOUS | Status: DC
  Administered 2018-09-11 – 2018-09-12 (×5): via INTRAVENOUS_CENTRAL
  Filled 2018-09-11 (×6): qty 150

## 2018-09-11 MED ORDER — SODIUM CHLORIDE 0.9% IV SOLUTION
Freq: Once | INTRAVENOUS | Status: AC
  Administered 2018-09-11: 14:00:00 via INTRAVENOUS

## 2018-09-11 MED ORDER — PIPERACILLIN-TAZOBACTAM 3.375 G IVPB
3.3750 g | Freq: Three times a day (TID) | INTRAVENOUS | Status: DC
  Administered 2018-09-11: 3.375 g via INTRAVENOUS
  Filled 2018-09-11: qty 50

## 2018-09-11 MED ORDER — HEPARIN SODIUM (PORCINE) 1000 UNIT/ML DIALYSIS
1000.0000 [IU] | INTRAMUSCULAR | Status: DC | PRN
  Filled 2018-09-11: qty 6

## 2018-09-11 MED ORDER — VANCOMYCIN HCL 10 G IV SOLR: 1250.0000 mg | INTRAVENOUS | Status: DC

## 2018-09-11 MED ORDER — HEPARIN (PORCINE) 25000 UT/250ML-% IV SOLN: 1000.0000 [IU]/h | INTRAVENOUS | Status: DC

## 2018-09-11 MED ORDER — SODIUM BICARBONATE 8.4 % IV SOLN
50.0000 meq | Freq: Once | INTRAVENOUS | Status: AC
  Administered 2018-09-11: 50 meq via INTRAVENOUS

## 2018-09-11 MED ORDER — PRO-STAT SUGAR FREE PO LIQD
60.0000 mL | Freq: Three times a day (TID) | ORAL | Status: DC
  Administered 2018-09-11 (×2): 60 mL
  Filled 2018-09-11 (×3): qty 60

## 2018-09-11 MED ORDER — INSULIN ASPART 100 UNIT/ML ~~LOC~~ SOLN: 2.0000 [IU] | SUBCUTANEOUS | Status: DC

## 2018-09-11 MED ORDER — VITAL HIGH PROTEIN PO LIQD: 1000.0000 mL | ORAL | Status: DC

## 2018-09-11 MED ORDER — VANCOMYCIN VARIABLE DOSE PER UNSTABLE RENAL FUNCTION (PHARMACIST DOSING): Status: DC

## 2018-09-11 MED ORDER — NOREPINEPHRINE 16 MG/250ML-% IV SOLN
0.0000 ug/min | INTRAVENOUS | Status: DC
  Filled 2018-09-11: qty 250

## 2018-09-11 MED ORDER — PRISMASOL BGK 4/2.5 32-4-2.5 MEQ/L IV SOLN
INTRAVENOUS | Status: DC
  Administered 2018-09-11 – 2018-09-12 (×6): via INTRAVENOUS_CENTRAL
  Filled 2018-09-11 (×12): qty 5000

## 2018-09-11 MED ORDER — PHENYLEPHRINE HCL-NACL 40-0.9 MG/250ML-% IV SOLN
0.0000 ug/min | INTRAVENOUS | Status: DC
  Administered 2018-09-11: 250 ug/min via INTRAVENOUS
  Administered 2018-09-11: 190 ug/min via INTRAVENOUS
  Administered 2018-09-11: 250 ug/min via INTRAVENOUS
  Administered 2018-09-12 (×2): 400 ug/min via INTRAVENOUS
  Filled 2018-09-11 (×6): qty 250

## 2018-09-11 MED ORDER — DEXTROSE 10 % IV SOLN: INTRAVENOUS | Status: DC | PRN

## 2018-09-11 MED ORDER — SODIUM CHLORIDE 0.9% IV SOLUTION: Freq: Once | INTRAVENOUS | Status: DC

## 2018-09-11 MED ORDER — NOREPINEPHRINE BITARTRATE 1 MG/ML IV SOLN
0.0000 ug/min | INTRAVENOUS | Status: DC
  Administered 2018-09-11 (×5): 40 ug/min via INTRAVENOUS
  Administered 2018-09-11: 35 ug/min via INTRAVENOUS
  Filled 2018-09-11 (×17): qty 4

## 2018-09-11 MED ORDER — INSULIN ASPART 100 UNIT/ML ~~LOC~~ SOLN: 1.0000 [IU] | SUBCUTANEOUS | Status: DC

## 2018-09-11 MED ORDER — PHENYLEPHRINE HCL-NACL 10-0.9 MG/250ML-% IV SOLN
0.0000 ug/min | INTRAVENOUS | Status: DC
  Administered 2018-09-11: 170 ug/min via INTRAVENOUS
  Administered 2018-09-11: 190 ug/min via INTRAVENOUS
  Administered 2018-09-11: 09:00:00 150 ug/min via INTRAVENOUS
  Administered 2018-09-11: 20 ug/min via INTRAVENOUS
  Filled 2018-09-11 (×7): qty 250
  Filled 2018-09-11: qty 500
  Filled 2018-09-11: qty 250

## 2018-09-11 MED ORDER — ACETAMINOPHEN 650 MG RE SUPP: 650.0000 mg | RECTAL | Status: DC | PRN

## 2018-09-11 MED ORDER — PIPERACILLIN-TAZOBACTAM 3.375 G IVPB 30 MIN
3.3750 g | Freq: Four times a day (QID) | INTRAVENOUS | Status: DC
  Administered 2018-09-11 – 2018-09-12 (×3): 3.375 g via INTRAVENOUS
  Filled 2018-09-11 (×7): qty 50

## 2018-09-11 MED ORDER — SODIUM CHLORIDE 0.9% IV SOLUTION
Freq: Once | INTRAVENOUS | Status: AC
  Administered 2018-09-11: 15:00:00 via INTRAVENOUS

## 2018-09-11 MED ORDER — PIPERACILLIN-TAZOBACTAM IN DEX 2-0.25 GM/50ML IV SOLN
2.2500 g | Freq: Four times a day (QID) | INTRAVENOUS | Status: DC
  Administered 2018-09-11: 2.25 g via INTRAVENOUS
  Filled 2018-09-11 (×2): qty 50

## 2018-09-11 MED ORDER — STERILE WATER FOR INJECTION IV SOLN
INTRAVENOUS | Status: DC
  Administered 2018-09-11 – 2018-09-12 (×8): via INTRAVENOUS_CENTRAL
  Filled 2018-09-11 (×15): qty 150

## 2018-09-11 MED FILL — Medication: Qty: 1 | Status: AC

## 2018-09-11 NOTE — Progress Notes (Signed)
CRITICAL VALUE ALERT  Critical Value: Lactic acid >11 and Hemoglobin 6.8   Date & Time Notied:  0900  Provider Notified: Dr. Halford Chessman  Orders Received/Actions taken: MD aware new orders given

## 2018-09-11 NOTE — Progress Notes (Signed)
CRITICAL VALUE ALERT  Critical Value:  Hemoglobin 6.1  Date & Time Notied:  09/11/2018 1500  Provider Notified: [name]  Orders Received/Actions taken: Infuse blood over an hour and check h&h in two hours

## 2018-09-11 NOTE — Procedures (Signed)
Attempted Left femoral artery catheter placement.  Cannulated vessel x2 but unable to pass wire. Pressure held x 10 minutes until hemostasis obtained.  Assessed bilateral radial arteries and likely too small / constricted to attempt at this time.  Will continue to monitor      [name], NP-C [place] [facility] Pgr: [phone] or if no answer [phone] 09/11/2018, 12:08 PM

## 2018-09-11 NOTE — Progress Notes (Signed)
Pharmacy Antibiotic Note  Kevin Potts is a 63 y.o. male admitted on 08/18/2018 with sepsis.  Pharmacy has been consulted for vancomycin and zosyn dosing. 09/11/2018  SCr 1.74>>4.98. WBC 16.8 on solucortef. AF To start CRRT   Plan: Change zosyn to 3.375 gm IV q6h while on CRRT vanc dc'd today by CCM  Height: 6' (182.9 cm) Weight: 233 lb 14.5 oz (106.1 kg) IBW/kg (Calculated) : 77.6  Temp (24hrs), Avg:97.5 F (36.4 C), Min:94.8 F (34.9 C), Max:98.9 F (37.2 C)  Recent Labs  Lab 09/09/18 0556 09/10/18 1018 09/10/18 1027 09/10/18 1056 09/10/18 1227 09/11/18 0308 09/11/18 0615 09/11/18 0800  WBC  --  7.4  --  14.2*  --  18.3*  --  16.8*  CREATININE 1.04  --  0.41* 1.70* 1.74* 4.98*  --   --   LATICACIDVEN  --   --   --   --   --  >11.0* >11*  --     Estimated Creatinine Clearance: 19.1 mL/min (A) (by C-G formula based on SCr of 4.98 mg/dL (H)).    No Known Allergies  Antimicrobials this admission: 6/1 zosyn >>  6/1 vancomycin >> 6/1  Dose adjustments this admission: 6/1 ZEI>> 2.25 IV q6  6/1 2.25 q6>>3.375 q6 for CRRT Microbiology results: 5/31 BCx2: sent 5/31 MRSA PCR neg  5/31 SA PCR neg 5/26 SARS CoV2 neg  Thank you for allowing pharmacy to be a part of this patient's care.  Leodis Sias T 09/11/2018 12:27 PM

## 2018-09-11 NOTE — Procedures (Signed)
Central Venous Catheter Insertion Procedure Note Ferry Matthis [id number] 01/02/1956  Procedure: Insertion of Central Venous Catheter Indications: Drug and/or fluid administration  Procedure Details Consent: Unable to obtain consent because of emergent medical necessity. Time Out: Verified patient identification, verified procedure, site/side was marked, verified correct patient position, special equipment/implants available, medications/allergies/relevent history reviewed, required imaging and test results available.  Performed  Maximum sterile technique was used including antiseptics, cap, gloves, gown, hand hygiene, mask and sheet. Skin prep: Chlorhexidine; local anesthetic administered A antimicrobial bonded/coated triple lumen catheter was placed in the right femoral vein due to emergent situation using the Seldinger technique.  Evaluation Blood flow good Complications: No apparent complications Patient did tolerate procedure well. Chest X-ray ordered to verify placement.  CXR: na.  Shellia Cleverly 09/11/2018, 4:25 AM

## 2018-09-11 NOTE — Progress Notes (Signed)
CRITICAL VALUE ALERT  Critical Value:  Hemoglobin 6.7, Calcium 6.3    Date & Time Notied:  1830  Provider Notified:  Dr. Halford Chessman   Orders Received/Actions taken: MD aware reviewing chart and will put in new orders

## 2018-09-11 NOTE — Progress Notes (Signed)
Called regarding ABG results > 6.896 / 34.3 / 99.6 / 24.3.  Dr. Halford Chessman aware.   Worsening shock and acidosis.  Concern for possible ongoing bleeding but patient too unstable to transport for CT imaging at this time.    Plan: Additional unit PRBC now Complete FFP (previously ordered) Additional amp bicarbonate now  Dr. Tresa Moore called and updated on patients status.    [name], NP-C [facility] Pgr: [phone] or if no answer [phone] 09/11/2018, 12:48 PM

## 2018-09-11 NOTE — Progress Notes (Signed)
[place] Progress Note Patient Name: Kevin Potts DOB: May 12, 1955 MRN: [id number]   Date of Service  09/11/2018  HPI/Events of Note  Nursing request for A-line.   eICU Interventions  Will order: 1. RT to place A-line.      Intervention Category Major Interventions: Other:  Lysle Dingwall 09/11/2018, 8:12 PM

## 2018-09-11 NOTE — Progress Notes (Signed)
Spoke with pt's wife and updated about current status and treatment plan.  Also explained the need for HD catheter placement in setting of AKI and metabolic acidosis.  Risks of procedure detailed as bleeding, infection, pneumothorax.  She is agreeable to procedure and confirmed consent with RN over the phone.  [name], MD [facility]monary/Critical Care 09/11/2018, 11:31 AM

## 2018-09-11 NOTE — Procedures (Signed)
Hemodialysis Catheter Insertion Procedure Note Kevin Potts [id number] 08/25/1955  Procedure: Insertion of Hemodialysis Catheter Indications: Hemodialysis  Procedure Details Consent: Risks of procedure as well as the alternatives and risks of each were explained to the (patient/caregiver).  Consent for procedure obtained.  Consent obtained from wife.   Time Out: Verified patient identification, verified procedure, site/side was marked, verified correct patient position, special equipment/implants available, medications/allergies/relevent history reviewed, required imaging and test results available.  Performed  Maximum sterile technique was used including antiseptics, cap, gloves, gown, hand hygiene, mask and sheet.  Skin prep: Chlorhexidine; local anesthetic administered  A Trialysis HD catheter was placed in the right internal jugular vein using the Seldinger technique.  Biopatch applied, sutured in place.   Evaluation Blood flow good Complications: No apparent complications Patient did tolerate procedure well. Chest X-ray ordered to verify placement.  CXR: pending.   Procedure performed under direct supervision of Dr. Halford Chessman and with ultrasound guidance for real time vessel cannulation.     Kevin Gens, NP-C [facility] Pgr: [phone] or [phone] 09/11/2018, 12:05 PM

## 2018-09-11 NOTE — Progress Notes (Signed)
Pt with significant worsening metabolic derangement. Low suspicion for any sort of acute pelvic / abdominal bleed. Likely end organ failure from ischemic insult during prior code. Discussed with critical care and agree he is not stable enough for advanced imaging or any sort of surgical exploration. He is anticoagulated for presumed PE etiology. Wife updated and understands gravity of situation and very real chance of near term motility.

## 2018-09-11 NOTE — Progress Notes (Signed)
ANTICOAGULATION CONSULT NOTE -  Consult  Pharmacy Consult for IV heparin (note received TNKase ~1030) Indication: pulmonary embolus  No Known Allergies  Patient Measurements: Height: 6' (182.9 cm) Weight: 233 lb 14.5 oz (106.1 kg) IBW/kg (Calculated) : 77.6 Heparin Dosing Weight: 99.7  Vital Signs: Temp: 98.6 F (37 C) (06/01 0400) Temp Source: Oral (06/01 0400) BP: 90/66 (06/01 0310) Pulse Rate: 109 (06/01 0310)  Labs: Recent Labs    09/10/18 1018  09/10/18 1056 09/10/18 1227 09/10/18 1413 09/10/18 2157 09/11/18 0308  HGB 5.9*  --  11.7*  --   --   --  8.3*  HCT 20.4*  --  39.6  --   --   --  28.7*  PLT 78*  --  128*  --   --   --  158  APTT 47*  --   --   --  54*  --  >200*  LABPROT 24.6*  --   --   --   --   --  34.0*  INR 2.3*  --   --   --   --   --  3.4*  HEPARINUNFRC  --   --   --   --   --  0.43 0.95*  CREATININE  --    < > 1.70* 1.74*  --   --  4.98*   < > = values in this interval not displayed.    Estimated Creatinine Clearance: 19.1 mL/min (A) (by C-G formula based on SCr of 4.98 mg/dL (H)).   Medical History: Past Medical History:  Diagnosis Date  . Diverticulitis   . Migraines     Medications:  Scheduled:  . chlorhexidine gluconate (MEDLINE KIT)  15 mL Mouth Rinse BID  . hydrocortisone sod succinate (SOLU-CORTEF) inj  100 mg Intravenous Q8H  . mouth rinse  15 mL Mouth Rinse 10 times per day  . mupirocin ointment  1 application Nasal BID  . pantoprazole (PROTONIX) IV  40 mg Intravenous Q24H  . vancomycin variable dose per unstable renal function (pharmacist dosing)   Does not apply See admin instructions   Infusions:  . sodium chloride 75 mL/hr at 09/11/18 0600  . sodium chloride Stopped (09/10/18 2207)  . epinephrine 20 mcg/min (09/11/18 0600)  . fentaNYL infusion INTRAVENOUS Stopped (09/11/18 0511)  . heparin    . insulin 4.4 mL/hr at 09/11/18 0600  . norepinephrine (LEVOPHED) Adult infusion 40 mcg/min (09/11/18 0600)  . phenylephrine  (NEO-SYNEPHRINE) Adult infusion    . piperacillin-tazobactam (ZOSYN)  IV    .  sodium bicarbonate  infusion 1000 mL 50 mL/hr at 09/11/18 0600  . vancomycin 250 mL/hr at 09/11/18 0600  . vasopressin (PITRESSIN) infusion - *FOR SHOCK* 0.03 Units/min (09/11/18 0600)    Assessment: 63 yo male POD2 robot-assisted prostectomy with plan to be discharged today found to be diaphoretic, lost consciousness and went into PEA presumable from a PE. Orders received to give TNKase and dose of '50mg'$  per patient's weight of >90kg administered per guidelines around 1030am. Patient subsequently transferred to ICU for further management. APTT is 54 sec and IV heparin can safely be started for treatment of presumed PE with guidelines stated that IV heparin cannot be administered until aPTT<80s.  5/31 2220 HL = 0.43 at goal, no infusion or bleeding issues reported by RN Today, 6/1 0437 HL = 0.95 above goal!. Spoke with RN who said heparin drip was turned off around 0321 d/t Hgb drop to 8.3. Now worsening scr= 4.98.  Goal of Therapy:  Heparin level 0.3-0.5 for first 24hrs s/p TNKase then can resume normal goal of 0.3-0.7 Monitor platelets by anticoagulation protocol: Yes   Plan:  1) Heparin drip is on hold now per MD. 2)F/u lab redraw this am for continuing heparin 2) Daily heparin level and CBC   Dorrene German 09/11/2018 6:26 AM

## 2018-09-11 NOTE — Progress Notes (Signed)
Pharmacy Antibiotic Note  Kevin Potts is a 63 y.o. male admitted on 09/02/2018 with sepsis.  Pharmacy has been consulted for vancomycin and zosyn dosing. 09/11/2018  SCr 1.74>>4.98. WBC 16.8 on solucortef. AF   Plan: Decrease zosyn to 2.25 gm IV q6h  Vancomycin 2 Gm x 1 given 6/1 at 06 am Will f/u scr/VR for next dose Goal AUC = 400-550 F/u culture/levels Consider stopping vancomycin w/ ARF and neg MRSA PCR Daily SCr  Height: 6' (182.9 cm) Weight: 233 lb 14.5 oz (106.1 kg) IBW/kg (Calculated) : 77.6  Temp (24hrs), Avg:98.3 F (36.8 C), Min:97.3 F (36.3 C), Max:98.9 F (37.2 C)  Recent Labs  Lab 09/09/18 0556 09/10/18 1018 09/10/18 1027 09/10/18 1056 09/10/18 1227 09/11/18 0308 09/11/18 0800  WBC  --  7.4  --  14.2*  --  18.3* 16.8*  CREATININE 1.04  --  0.41* 1.70* 1.74* 4.98*  --   LATICACIDVEN  --   --   --   --   --  >11.0*  --     Estimated Creatinine Clearance: 19.1 mL/min (A) (by C-G formula based on SCr of 4.98 mg/dL (H)).    No Known Allergies  Antimicrobials this admission: 6/1 zosyn >>  6/1 vancomycin >>   Dose adjustments this admission: 6/1 ZEI>> 2.26 IV q6  Microbiology results: 5/31 BCx2: sent 5/31 MRSA PCR neg  5/31 SA PCR neg 5/26 SARS CoV2 neg  Thank you for allowing pharmacy to be a part of this patient's care.  Leodis Sias T 09/11/2018 8:35 AM

## 2018-09-11 NOTE — Progress Notes (Addendum)
This RN, plus two other RNs were unable to obtain a blood pressure. This RN called over to E link. E link RN said she would get the physician to camera in. This RN was finally able to get a blood pressure of 53/38. Currently at max on epinephrine, vasopressin and peripheral dose of levophed.   E link physician involved. Dr. Mariane Masters consulted and at bedside placed a central line in the femoral artery. Dr. Mariane Masters gave verbal order to utilize the femoral CVC. Levophed increased accordingly for soft blood pressures.  Pt increasing aggitation. Fentanyl boluses have been dropping blood pressure so physician had neo ordered. This RN initiated neo, in order to increase fentanyl and decrease agitation. Pt. Becoming more alert and oriented, but blood pressures remain low with MAPS in the 50s and low 60s.   Will continue to monitor and adjust drips accordingly.   This RN has called and updated the pt wife of the night's events.

## 2018-09-11 NOTE — TOC Initial Note (Signed)
Transition of Care [facility]) - Initial/Assessment Note    Patient Details  Name: Kevin Potts MRN: [id number] Date of Birth: Jun 19, 1955  [facility] CM/SW Contact:    Joaquin Courts, RN Phone Number: 09/11/2018, 12:46 PM  Clinical Narrative:   Pt from home                 Expected Discharge Plan: Home/Self Care Barriers to Discharge: Continued Medical Work up   Patient Goals and CMS Choice Patient states their goals for this hospitalization and ongoing recovery are:: (intubated, unable to ask)      Expected Discharge Plan and Services Expected Discharge Plan: Home/Self Care       Living arrangements for the past 2 months: Single Family Home Expected Discharge Date: 09/10/18                                    Prior Living Arrangements/Services Living arrangements for the past 2 months: Single Family Home Lives with:: Spouse Patient language and need for interpreter reviewed:: Yes Do you feel safe going back to the place where you live?: (intubated, unable to assess)      Need for Family Participation in Patient Care: Yes (Comment) Care giver support system in place?: Yes (comment)   Criminal Activity/Legal Involvement Pertinent to Current Situation/Hospitalization: No - Comment as needed  Activities of Daily Living Home Assistive Devices/Equipment: None ADL Screening (condition at time of admission) Patient's cognitive ability adequate to safely complete daily activities?: Yes Is the patient deaf or have difficulty hearing?: No Does the patient have difficulty seeing, even when wearing glasses/contacts?: No Does the patient have difficulty concentrating, remembering, or making decisions?: No Patient able to express need for assistance with ADLs?: Yes Does the patient have difficulty dressing or bathing?: No Independently performs ADLs?: Yes (appropriate for developmental age) Does the patient have difficulty walking or climbing stairs?:  No Weakness of Legs: None Weakness of Arms/Hands: None  Permission Sought/Granted                  Emotional Assessment Appearance:: Appears stated age Attitude/Demeanor/Rapport: (intubated) Affect (typically observed): (intubated) Orientation: : (intubated)   Psych Involvement: No (comment)  Admission diagnosis:  PROSTATE CANCER Patient Active Problem List   Diagnosis Date Noted  . Prostate cancer ([place]) 08/19/2018  . Rectal bleeding 11/17/2017  . Diverticulitis of colon 11/17/2017   PCP:  Asencion Noble, MD Pharmacy:   [facility], [place] [address] Phone: [phone] Fax: [phone]  [id number] [place], [place] [address] [place] [address] Phone: [phone] Fax: [phone]     Social Determinants of Health (SDOH) Interventions    Readmission Risk Interventions Readmission Risk Prevention Plan 09/11/2018  Transportation Screening Complete  PCP or Specialist Appt within 3-5 Days Not Complete  Not Complete comments not yet ready for d/c  HRI or [place] Complete  Social Work Consult for [place] Planning/Counseling Complete  Palliative Care Screening Not Applicable  Medication Review Press photographer) Complete  Some recent data might be hidden

## 2018-09-11 NOTE — Progress Notes (Signed)
NAME:  Kevin Potts, MRN:  [id number], DOB:  12-Oct-1955, LOS: 1 ADMISSION DATE:  08/11/2018, CONSULTATION DATE:  09/10/2018 REFERRING MD:  Dr. Lovena Neighbours, Urology, CHIEF COMPLAINT:  Cardiac arrest   Brief History   63 yo male admitted for prostatectomy on 5/29, and robotic assisted laparoscopic radical prostatectomy with b/l pelvic laminectomy.  Was to be discharged home 5/31 when he developed sudden onset of diaphoresis, dyspnea, and then loss of consciousness.  Found to have PEA.  Empirically treated with thrombolytic.  Had ROSC after about 30 minutes.  Past Medical History  Hypertension, Migraine HA, Diverticulosis  Significant Hospital Events   5/29 Admit 5/31 Cardiac arrest, TNK given 6/01 on multiple pressors, renal consulted, transfuse PRBC  Consults:  Urology Nephrology  Procedures:  ETT 5/31 >>  Rt femoral CVL 6/01 >>  Significant Diagnostic Tests:  Echo 5/31 >> EF greater than 65%, mild LVH, mild RV systolic dysfx, mildly enlarged RV cavity, dilated IVC Doppler legs b/l 5/31 >> no DVT  Micro Data:  Blood 5/31 >>   Antimicrobials:   Zosyn 5/31 >>  Vancomycin 5/31 >>   Interim history/subjective:  On multiple pressors, Hb dropped, remains on vent, no urine outpt and no urine on bladder scan.  Objective   Blood pressure (!) 80/50, pulse (!) 102, temperature 98.4 F (36.9 C), temperature source Oral, resp. rate (!) 27, height 6' (1.829 m), weight 106.1 kg, SpO2 98 %.    Vent Mode: PRVC FiO2 (%):  [40 %-100 %] 40 % Set Rate:  [24 bmp] 24 bmp Vt Set:  [673 mL] 620 mL PEEP:  [5 cmH20] 5 cmH20 Plateau Pressure:  [19 cmH20-29 cmH20] 20 cmH20   Intake/Output Summary (Last 24 hours) at 09/11/2018 4193 Last data filed at 09/11/2018 0810 Gross per 24 hour  Intake 8409.59 ml  Output 750 ml  Net 7659.59 ml   Filed Weights   08/20/2018 0611 08/11/2018 1200  Weight: 103 kg 106.1 kg    Examination:  General - on vent Eyes - pupils reactive ENT - ETT in place Cardiac -  regular rate/rhythm, no murmur Chest - scattered rhonchi Abdomen - soft, mildly distended, + bowel sounds, bruising around surgical sites Extremities - no edema, cool, mottling in lower extremities Skin - no rashes Neuro - follows commands, and moves extremities GU - catheter in place, scrotal swelling with bruising   Resolved Hospital Problem list     Assessment & Plan:   Shock. Discussion: Initial concern was for PEA arrest 2nd to acute pulmonary embolism for which he was treated with thrombolytics.  On 6/01 I am concerned he has hemorrhagic shock with acute blood loss anemia related to bleeding from surgical sites after getting thrombolytics +/- intraabdominal sepsis from bacterial translocation in setting of shock. Plan - hold additional anticoagulation for now - transfuse PRBC and f/u Hb - continue ABx - pressors to keep MAP > 65 - continue solucortef  Acute respiratory failure in setting of PEA arrest. Plan - continue full vent support - f/u CXR, ABG  Acute kidney injury from ATN. Metabolic acidosis with lactic acidosis. Plan - renal consulted; concerned he will need CRRT - continue HCO3 in IV fluid for now - f/u BMET, ABG  S/p prostatectomy. Plan - post op care per urology - likely will need CT imaging of abdomen/pelvis when more stable  Acute metabolic encephalopathy. Hx of migraine headaches. Plan - RASS goal 0 - hold outpt topamax  Hyperglycemia. Plan - insulin gtt  Best practice:  Diet:  trickle tube feeds DVT prophylaxis: SCDs GI prophylaxis: Protonix Mobility: bed rest Code Status: full code Disposition: ICU  Labs    CMP Latest Ref Rng & Units 09/11/2018 09/10/2018 09/10/2018  Glucose 70 - 99 mg/dL 345(H) 465(H) 332(H)  BUN 8 - 23 mg/dL 26(H) 13 12  Creatinine 0.61 - 1.24 mg/dL 4.98(H) 1.74(H) 1.70(H)  Sodium 135 - 145 mmol/L 141 136 142  Potassium 3.5 - 5.1 mmol/L 4.9 2.7(LL) 3.7  Chloride 98 - 111 mmol/L 108 108 109  CO2 22 - 32 mmol/L  10(L) 14(L) 12(L)  Calcium 8.9 - 10.3 mg/dL 6.9(L) 6.4(LL) 7.9(L)  Total Protein 6.5 - 8.1 g/dL - 4.7(L) 5.3(L)  Total Bilirubin 0.3 - 1.2 mg/dL - 0.7 0.5  Alkaline Phos 38 - 126 U/L - 85 67  AST 15 - 41 U/L - 281(H) 209(H)  ALT 0 - 44 U/L - 265(H) <5   CBC Latest Ref Rng & Units 09/11/2018 09/11/2018 09/10/2018  WBC 4.0 - 10.5 K/uL 16.8(H) 18.3(H) 14.2(H)  Hemoglobin 13.0 - 17.0 g/dL 6.8(LL) 8.3(L) 11.7(L)  Hematocrit 39.0 - 52.0 % 22.5(L) 28.7(L) 39.6  Platelets 150 - 400 K/uL 134(L) 158 128(L)   ABG    Component Value Date/Time   PHART 7.205 (L) 09/10/2018 1148   PCO2ART 34.3 09/10/2018 1148   PO2ART 178 (H) 09/10/2018 1148   HCO3 13.0 (L) 09/10/2018 1148   ACIDBASEDEF 13.7 (H) 09/10/2018 1148   O2SAT 98.7 09/10/2018 1148   CBG (last 3)  Recent Labs    09/11/18 0234 09/11/18 0649 09/11/18 0736  GLUCAP 278* 150* 139*    CC time 37 minutes  [name], MD [place] 09/11/2018, 8:44 AM

## 2018-09-11 NOTE — Progress Notes (Addendum)
Pharmacy Antibiotic Note  Kevin Potts is a 63 y.o. male admitted on 08/26/2018 with sepsis.  Pharmacy has been consulted for vancomycin and zosyn dosing.  Plan: Zosyn 3.375g IV q8h (4 hour infusion).  Vancomycin 2 Gm x1 Scr this am worsening = 4.98 Will f/u scr/VR for next dose Goal AUC = 400-550 F/u culture/levels Daily SCr  Height: 6' (182.9 cm) Weight: 233 lb 14.5 oz (106.1 kg) IBW/kg (Calculated) : 77.6  Temp (24hrs), Avg:98.5 F (36.9 C), Min:97.3 F (36.3 C), Max:99.7 F (37.6 C)  Recent Labs  Lab 09/09/18 0556 09/10/18 1018 09/10/18 1027 09/10/18 1056 09/10/18 1227  WBC  --  7.4  --  14.2*  --   CREATININE 1.04  --  0.41* 1.70* 1.74*    Estimated Creatinine Clearance: 54.7 mL/min (A) (by C-G formula based on SCr of 1.74 mg/dL (H)).    No Known Allergies  Antimicrobials this admission: 6/1 zosyn >>  6/1 vancomycin >>   Dose adjustments this admission:   Microbiology results:  BCx:   UCx:    Sputum:    MRSA PCR:   Thank you for allowing pharmacy to be a part of this patient's care.  Dorrene German 09/11/2018 3:06 AM

## 2018-09-11 NOTE — Progress Notes (Signed)
3 Days Post-Op   Subjective/Chief Complaint:  1 - Acute CV Collapse - PEA arrest late AM POD 2 (5/31)ust prior to planned discharge following uncomplicated prostatectomy. Presumed PE and given thrombolytics / anticoagulation. CPR/Presors and ICU transfer with intubation. Worsening metabolic derangement 6/1 and begin CRRT via Rt neck line and Rt groin A-Line placed.   2 - Prostate Cancer - s/p uncomplicated robotic prostatectomy with node diessection 08/29/2018, day of admission. JP removed as output scant. Path pending.   3 - Acute Renal Failure - Cr 4.9 6/1 with decrease UOP after PEA arrest 5/31. On high dose pressors. Baseline Cr <1.3. CRRT started 6/1.  Today "[place]" is worsening. Profound lactic acidosis and new ARF, transaminitis, and decreasing Hgb.    Objective: Vital signs in last 24 hours: Temp:  [94 F (34.4 C)-98.6 F (37 C)] 95.6 F (35.3 C) (06/01 1500) Pulse Rate:  [38-119] 55 (06/01 1600) Resp:  [16-37] 30 (06/01 1600) BP: (44-137)/(13-92) 92/65 (06/01 1600) SpO2:  [81 %-100 %] 92 % (06/01 1618) FiO2 (%):  [40 %-80 %] 80 % (06/01 1618) Last BM Date: 09/09/18  Intake/Output from previous day: 05/31 0701 - 06/01 0700 In: 7668.5 [P.O.:1200; I.V.:5952.1; IV Piggyback:516.4] Out: 750 [Urine:750] Intake/Output this shift: Total I/O In: 6401.7 [I.V.:5413.3; Blood:658; IV Piggyback:330.4] Out: 38 [Other:38]  General appearance: obtunded in ICU. more palor today.  Eyes: negative Nose: Nares normal. Septum midline. Mucosa normal. No drainage or sinus tenderness. Throat: ETT in place on vent Neck: Rt CRRT line in place w/o hematomas.  Back: symmetric, no curvature. ROM normal. No CVA tenderness. Resp: non-coarse on vent.  Chest wall: no tenderness Cardio: HR 80s and regular by bedside monitor.  GI: soft, non-tender; bowel sounds normal; no masses,  no organomegaly Male genitalia: scrotal edema and mild penoscrotal ecchymoses. Foley in place wtih some muddy urine that  is non-clotted.  Extremities: increasing edema. Rt groin A-Line w/o hematomas.  Skin: Skin color, texture, turgor normal. No rashes or lesions Lymph nodes: Cervical, supraclavicular, and axillary nodes normal. Neurologic: Mental status: Sedated, opens eyes to name.  Incision/Wound: recent port sites with small echhymoses, no active cutaneous bleed.   Lab Results:  Recent Labs    09/11/18 0308 09/11/18 0800 09/11/18 1442  WBC 18.3* 16.8*  --   HGB 8.3* 6.8* 6.1*  HCT 28.7* 22.5* 19.8*  PLT 158 134*  --    BMET Recent Labs    09/10/18 1227 09/11/18 0308  NA 136 141  K 2.7* 4.9  CL 108 108  CO2 14* 10*  GLUCOSE 465* 345*  BUN 13 26*  CREATININE 1.74* 4.98*  CALCIUM 6.4* 6.9*   PT/INR Recent Labs    09/10/18 1018 09/11/18 0308  LABPROT 24.6* 34.0*  INR 2.3* 3.4*   ABG Recent Labs    09/10/18 1148 09/11/18 1210  PHART 7.205* 6.896*  HCO3 13.0* 6.5*    Studies/Results: Dg Abd 1 View  Result Date: 09/11/2018 CLINICAL DATA:  Orogastric tube placement EXAM: ABDOMEN - 1 VIEW COMPARISON:  None. FINDINGS: Orogastric tube tip and side port are in the stomach. Moderate air noted in stomach. Visualized bowel loops otherwise appear unremarkable. No free air evident. Atelectatic change noted in the left base. Lung bases elsewhere clear. IMPRESSION: Orogastric tube tip and side port in stomach. Moderate air in stomach. Loops appear otherwise unremarkable. No free air evident. There is atelectatic change in the left lung base. Lung bases otherwise are clear. Visualized bowel otherwise grossly unremarkable. No free air. Left base  atelectasis. Electronically Signed   By: Lowella Grip III M.D.   On: 09/11/2018 10:03   Dg Chest Port 1 View  Result Date: 09/11/2018 CLINICAL DATA:  Central line placement. EXAM: PORTABLE CHEST 1 VIEW COMPARISON:  Radiographs 09/11/2018 and 09/10/2018 FINDINGS: 1219 hours. New right IJ central venous catheter projects to the mid SVC level. The  endotracheal and enteric tubes are unchanged in position. There is no pneumothorax. Mild atelectasis at both lung bases appears slightly worse. The heart size and mediastinal contours are stable. No acute osseous findings are seen. IMPRESSION: Right IJ central venous catheter projects to the mid SVC level. No pneumothorax. Mildly progressive bibasilar atelectasis. Electronically Signed   By: Richardean Sale M.D.   On: 09/11/2018 12:42   Dg Chest Port 1 View  Result Date: 09/11/2018 CLINICAL DATA:  Respiratory failure. EXAM: PORTABLE CHEST 1 VIEW COMPARISON:  Chest x-ray from yesterday. FINDINGS: Unchanged endotracheal and enteric tubes. Stable cardiomediastinal silhouette. Low lung volumes. Mild left basilar atelectasis. No focal consolidation, pleural effusion, or pneumothorax. No acute osseous abnormality. IMPRESSION: Unchanged endotracheal and enteric tubes.  No active disease. Electronically Signed   By: Titus Dubin M.D.   On: 09/11/2018 09:37   Dg Chest Port 1 View  Result Date: 09/10/2018 CLINICAL DATA:  Status post intubation. EXAM: PORTABLE CHEST 1 VIEW COMPARISON:  None. FINDINGS: Endotracheal tube is in place with the tip in good position at the level of the clavicular heads, 5 cm above the carina. Lungs are clear. Heart size is normal. No pneumothorax or pleural fluid. Defibrillator pads noted. IMPRESSION: ETT in good position.  Lungs are clear. Electronically Signed   By: Inge Rise M.D.   On: 09/10/2018 11:07   Vas [place] Lower Extremity Venous (dvt)  Result Date: 09/10/2018  Lower Venous Study Indications: Edema.  Risk Factors: Prostate cancer. Recent prostatectomy. PEA arrest while dressing for discharge. Limitations: Body habitus and edema. Comparison Study: No prior study on file for comparison Performing Technologist: Abram Sander RVS  Examination Guidelines: A complete evaluation includes B-mode imaging, spectral Doppler, color Doppler, and power Doppler as needed of all  accessible portions of each vessel. Bilateral testing is considered an integral part of a complete examination. Limited examinations for reoccurring indications may be performed as noted.  +---------+---------------+---------+-----------+----------+--------------+ RIGHT    CompressibilityPhasicitySpontaneityPropertiesSummary        +---------+---------------+---------+-----------+----------+--------------+ CFV      Full           Yes      Yes                                 +---------+---------------+---------+-----------+----------+--------------+ SFJ      Full                                                        +---------+---------------+---------+-----------+----------+--------------+ FV Prox  Full                                                        +---------+---------------+---------+-----------+----------+--------------+ FV Mid   Full                                                        +---------+---------------+---------+-----------+----------+--------------+  FV DistalFull                                                        +---------+---------------+---------+-----------+----------+--------------+ PFV      Full                                                        +---------+---------------+---------+-----------+----------+--------------+ POP      Full           Yes      Yes                                 +---------+---------------+---------+-----------+----------+--------------+ PTV      Full                                                        +---------+---------------+---------+-----------+----------+--------------+ PERO                                                  Not visualized +---------+---------------+---------+-----------+----------+--------------+   +---------+---------------+---------+-----------+----------+--------------+ LEFT     CompressibilityPhasicitySpontaneityPropertiesSummary         +---------+---------------+---------+-----------+----------+--------------+ CFV      Full           Yes      Yes                                 +---------+---------------+---------+-----------+----------+--------------+ SFJ      Full                                                        +---------+---------------+---------+-----------+----------+--------------+ FV Prox  Full                                                        +---------+---------------+---------+-----------+----------+--------------+ FV Mid   Full                                                        +---------+---------------+---------+-----------+----------+--------------+ FV DistalFull                                                        +---------+---------------+---------+-----------+----------+--------------+  PFV      Full                                                        +---------+---------------+---------+-----------+----------+--------------+ POP      Full           Yes      Yes                                 +---------+---------------+---------+-----------+----------+--------------+ PTV      Full                                                        +---------+---------------+---------+-----------+----------+--------------+ PERO                                                  Not visualized +---------+---------------+---------+-----------+----------+--------------+     Summary: Right: There is no evidence of deep vein thrombosis in the lower extremity. However, portions of this examination were limited- see technologist comments above. Left: There is no evidence of deep vein thrombosis in the lower extremity. However, portions of this examination were limited- see technologist comments above.  *See table(s) above for measurements and observations.    Preliminary     Anti-infectives: Anti-infectives (From admission, onward)   Start     Dose/Rate Route  Frequency Ordered Stop   09/28/2018 0600  vancomycin (VANCOCIN) 1,250 mg in sodium chloride 0.9 % 250 mL IVPB  Status:  Discontinued     1,250 mg 166.7 mL/hr over 90 Minutes Intravenous Every 24 hours 09/11/18 0315 09/11/18 0534   09/11/18 1800  piperacillin-tazobactam (ZOSYN) IVPB 3.375 g     3.375 g 100 mL/hr over 30 Minutes Intravenous Every 6 hours 09/11/18 1231     09/11/18 1200  piperacillin-tazobactam (ZOSYN) IVPB 2.25 g  Status:  Discontinued     2.25 g 100 mL/hr over 30 Minutes Intravenous Every 6 hours 09/11/18 0834 09/11/18 1231   09/11/18 0535  vancomycin variable dose per unstable renal function (pharmacist dosing)  Status:  Discontinued      Does not apply See admin instructions 09/11/18 0535 09/11/18 0901   09/11/18 0330  piperacillin-tazobactam (ZOSYN) IVPB 3.375 g  Status:  Discontinued     3.375 g 12.5 mL/hr over 240 Minutes Intravenous Every 8 hours 09/11/18 0303 09/11/18 0834   09/11/18 0330  vancomycin (VANCOCIN) 2,000 mg in sodium chloride 0.9 % 500 mL IVPB     2,000 mg 250 mL/hr over 120 Minutes Intravenous  Once 09/11/18 0303 09/11/18 0810   08/29/2018 0534  ceFAZolin (ANCEF) IVPB 2g/100 mL premix     2 g 200 mL/hr over 30 Minutes Intravenous 30 min pre-op 08/16/2018 0534 08/27/2018 0739   09/10/2018 0000  sulfamethoxazole-trimethoprim (BACTRIM DS) 800-160 MG tablet     1 tablet Oral 2 times daily 08/25/2018 0736        Assessment/Plan:  1 - Acute CV Collapse - agree with current pre-emptive PE treatment,  pending additional eval. Greatly appreciate Critical care team heroic efforts in this man with minimal baseline comorbidity. Prognosis very guarded.   2 - Prostate Cancer -  No further cancer directed care.   3 - Acute Renal Failure - appreciate nephrology team and CRRT management.     Alexis Frock 09/11/2018

## 2018-09-11 NOTE — Consult Note (Signed)
Reason for Consult: Acute kidney injury, severe metabolic acidosis Referring Physician: [name], MD (CCM)  HPI:  63 year old Caucasian man with past medical history significant for diverticulitis and migraine type headaches and unknown baseline renal function.  Underwent robotic assisted prostatectomy on 08/14/2018 and was preparing for discharge home yesterday when he had a PEA arrest presumptively from PE for which he underwent thrombolytic therapy along with resuscitative efforts with ROSC in about 30 minutes.  Overnight with worsening renal function, metabolic acidosis and circulatory shock requiring increasing pressors and on ventilator support.  Also noted to have progressive anemia likely from postoperative site bleeding following recent thrombolytic therapy.  750 cc urine output overnight-this a.m.  Past Medical History:  Diagnosis Date  . Diverticulitis   . Migraines     Past Surgical History:  Procedure Laterality Date  . COLONOSCOPY  2009  . COLONOSCOPY N/A 11/28/2017   Procedure: COLONOSCOPY;  Surgeon: Rogene Houston, MD;  Location: [facility];  Service: Endoscopy;  Laterality: N/A;  9:15  . LYMPHADENECTOMY Bilateral 09/05/2018   Procedure: LYMPHADENECTOMY;  Surgeon: Alexis Frock, MD;  Location: [facility];  Service: Urology;  Laterality: Bilateral;  . POLYPECTOMY  11/28/2017   Procedure: POLYPECTOMY;  Surgeon: Rogene Houston, MD;  Location: [facility];  Service: Endoscopy;;  hepatic flexure polyp  . ROBOT ASSISTED LAPAROSCOPIC RADICAL PROSTATECTOMY N/A 09/03/2018   Procedure: XI ROBOTIC ASSISTED LAPAROSCOPIC RADICAL PROSTATECTOMY;  Surgeon: Alexis Frock, MD;  Location: [facility];  Service: Urology;  Laterality: N/A;  3 HRS    History reviewed. No pertinent family history.  Social History:  reports that he has never smoked. He has never used smokeless tobacco. He reports current alcohol use. He reports that he does not use drugs.  Allergies: No Known  Allergies  Medications:  Scheduled: . sodium chloride   Intravenous Once  . chlorhexidine gluconate (MEDLINE KIT)  15 mL Mouth Rinse BID  . feeding supplement (PRO-STAT SUGAR FREE 64)  60 mL Per Tube TID  . [START ON Oct 11, 2018] feeding supplement (VITAL HIGH PROTEIN)  1,000 mL Per Tube Q24H  . hydrocortisone sod succinate (SOLU-CORTEF) inj  50 mg Intravenous Q6H  . mouth rinse  15 mL Mouth Rinse 10 times per day  . mupirocin ointment  1 application Nasal BID  . pantoprazole (PROTONIX) IV  40 mg Intravenous Q24H  . sodium bicarbonate        BMP Latest Ref Rng & Units 09/11/2018 09/10/2018 09/10/2018  Glucose 70 - 99 mg/dL 345(H) 465(H) 332(H)  BUN 8 - 23 mg/dL 26(H) 13 12  Creatinine 0.61 - 1.24 mg/dL 4.98(H) 1.74(H) 1.70(H)  Sodium 135 - 145 mmol/L 141 136 142  Potassium 3.5 - 5.1 mmol/L 4.9 2.7(LL) 3.7  Chloride 98 - 111 mmol/L 108 108 109  CO2 22 - 32 mmol/L 10(L) 14(L) 12(L)  Calcium 8.9 - 10.3 mg/dL 6.9(L) 6.4(LL) 7.9(L)   CBC Latest Ref Rng & Units 09/11/2018 09/11/2018 09/10/2018  WBC 4.0 - 10.5 K/uL 16.8(H) 18.3(H) 14.2(H)  Hemoglobin 13.0 - 17.0 g/dL 6.8(LL) 8.3(L) 11.7(L)  Hematocrit 39.0 - 52.0 % 22.5(L) 28.7(L) 39.6  Platelets 150 - 400 K/uL 134(L) 158 128(L)     Dg Abd 1 View  Result Date: 09/11/2018 CLINICAL DATA:  Orogastric tube placement EXAM: ABDOMEN - 1 VIEW COMPARISON:  None. FINDINGS: Orogastric tube tip and side port are in the stomach. Moderate air noted in stomach. Visualized bowel loops otherwise appear unremarkable. No free air evident. Atelectatic change noted in the left  base. Lung bases elsewhere clear. IMPRESSION: Orogastric tube tip and side port in stomach. Moderate air in stomach. Loops appear otherwise unremarkable. No free air evident. There is atelectatic change in the left lung base. Lung bases otherwise are clear. Visualized bowel otherwise grossly unremarkable. No free air. Left base atelectasis. Electronically Signed   By: Lowella Grip III M.D.    On: 09/11/2018 10:03   Dg Chest Port 1 View  Result Date: 09/11/2018 CLINICAL DATA:  Respiratory failure. EXAM: PORTABLE CHEST 1 VIEW COMPARISON:  Chest x-ray from yesterday. FINDINGS: Unchanged endotracheal and enteric tubes. Stable cardiomediastinal silhouette. Low lung volumes. Mild left basilar atelectasis. No focal consolidation, pleural effusion, or pneumothorax. No acute osseous abnormality. IMPRESSION: Unchanged endotracheal and enteric tubes.  No active disease. Electronically Signed   By: Titus Dubin M.D.   On: 09/11/2018 09:37   Dg Chest Port 1 View  Result Date: 09/10/2018 CLINICAL DATA:  Status post intubation. EXAM: PORTABLE CHEST 1 VIEW COMPARISON:  None. FINDINGS: Endotracheal tube is in place with the tip in good position at the level of the clavicular heads, 5 cm above the carina. Lungs are clear. Heart size is normal. No pneumothorax or pleural fluid. Defibrillator pads noted. IMPRESSION: ETT in good position.  Lungs are clear. Electronically Signed   By: Inge Rise M.D.   On: 09/10/2018 11:07   Vas [place] Lower Extremity Venous (dvt)  Result Date: 09/10/2018  Lower Venous Study Indications: Edema.  Risk Factors: Prostate cancer. Recent prostatectomy. PEA arrest while dressing for discharge. Limitations: Body habitus and edema. Comparison Study: No prior study on file for comparison Performing Technologist: Abram Sander RVS  Examination Guidelines: A complete evaluation includes B-mode imaging, spectral Doppler, color Doppler, and power Doppler as needed of all accessible portions of each vessel. Bilateral testing is considered an integral part of a complete examination. Limited examinations for reoccurring indications may be performed as noted.  +---------+---------------+---------+-----------+----------+--------------+ RIGHT    CompressibilityPhasicitySpontaneityPropertiesSummary        +---------+---------------+---------+-----------+----------+--------------+ CFV       Full           Yes      Yes                                 +---------+---------------+---------+-----------+----------+--------------+ SFJ      Full                                                        +---------+---------------+---------+-----------+----------+--------------+ FV Prox  Full                                                        +---------+---------------+---------+-----------+----------+--------------+ FV Mid   Full                                                        +---------+---------------+---------+-----------+----------+--------------+ FV DistalFull                                                        +---------+---------------+---------+-----------+----------+--------------+  PFV      Full                                                        +---------+---------------+---------+-----------+----------+--------------+ POP      Full           Yes      Yes                                 +---------+---------------+---------+-----------+----------+--------------+ PTV      Full                                                        +---------+---------------+---------+-----------+----------+--------------+ PERO                                                  Not visualized +---------+---------------+---------+-----------+----------+--------------+   +---------+---------------+---------+-----------+----------+--------------+ LEFT     CompressibilityPhasicitySpontaneityPropertiesSummary        +---------+---------------+---------+-----------+----------+--------------+ CFV      Full           Yes      Yes                                 +---------+---------------+---------+-----------+----------+--------------+ SFJ      Full                                                        +---------+---------------+---------+-----------+----------+--------------+ FV Prox  Full                                                         +---------+---------------+---------+-----------+----------+--------------+ FV Mid   Full                                                        +---------+---------------+---------+-----------+----------+--------------+ FV DistalFull                                                        +---------+---------------+---------+-----------+----------+--------------+ PFV      Full                                                        +---------+---------------+---------+-----------+----------+--------------+  POP      Full           Yes      Yes                                 +---------+---------------+---------+-----------+----------+--------------+ PTV      Full                                                        +---------+---------------+---------+-----------+----------+--------------+ PERO                                                  Not visualized +---------+---------------+---------+-----------+----------+--------------+     Summary: Right: There is no evidence of deep vein thrombosis in the lower extremity. However, portions of this examination were limited- see technologist comments above. Left: There is no evidence of deep vein thrombosis in the lower extremity. However, portions of this examination were limited- see technologist comments above.  *See table(s) above for measurements and observations.    Preliminary     Review of Systems  Unable to perform ROS: Critical illness   Blood pressure (!) 106/54, pulse 90, temperature (!) 96.4 F (35.8 C), temperature source Axillary, resp. rate (!) 24, height 6' (1.829 m), weight 106.1 kg, SpO2 97 %. Physical Exam  Nursing note and vitals reviewed. Constitutional: He appears well-developed and well-nourished.  Intubated, turns head to calling his name  HENT:  Head: Normocephalic and atraumatic.  Nose: Nose normal.  Eyes: Pupils are equal, round, and reactive to light. EOM are normal.   Neck: Normal range of motion. Neck supple. No JVD present.  Intubated  Cardiovascular: Normal rate and regular rhythm.  Murmur heard. 2/6 ejection systolic murmur  Respiratory: Effort normal and breath sounds normal. He has no wheezes. He has no rales.  GI: Soft. There is no abdominal tenderness. There is no rebound.  Musculoskeletal:        General: No edema.  Skin: Skin is warm and dry. No rash noted.    Assessment/Plan: 1.  Acute kidney injury with profound metabolic acidosis: Refractory to medical management and worsening lactic acidosis in the setting of circulatory shock.  Will order for CRRT for rapid acid buffering.  Will not order anticoagulation with suspected ongoing bleeding. 2.  Circulatory shock status post PEA arrest: Presumptively secondary to PE, compounded further by acute kidney injury and worsening lactic acidosis.  Currently on maximum pressors with the intention to start CRRT. 3.  Status post robotic assisted laparoscopic prostatectomy 4.  Acute blood loss anemia: Suspected to be postoperative bleeding exacerbated by recent thrombolytic therapy, ongoing supportive management with PRBCs.  Russel Morain K. 09/11/2018, 11:56 AM

## 2018-09-11 NOTE — Progress Notes (Addendum)
RT attempted A-line X's 2 without success, RT unable to thread catheter.  RN aware, RT to monitor and assess as needed.

## 2018-09-11 NOTE — Progress Notes (Signed)
[place] Progress Note Patient Name: [name] DOB: 04/20/1955 MRN: [id number]   Date of Service  09/11/2018  HPI/Events of Note  Ca++ = 5.1 and PO4--- = 16 (Ca++ x PO4--- double product = 80).   eICU Interventions  Will hold off on Ca++ replacement d/t double product.      Intervention Category Major Interventions: Electrolyte abnormality - evaluation and management  Sommer,Steven Eugene 09/11/2018, 10:17 PM

## 2018-09-11 NOTE — Progress Notes (Signed)
Initial Nutrition Assessment  RD working remotely.   DOCUMENTATION CODES:   Obesity unspecified  INTERVENTION:  - will order trickle rate TF regimen given patient on 4 pressors. - Vital High Protein @ 15 ml/hr with 60 ml prostat TID which will provide 960 kcal, 121 grams protein, and 301 ml free water.  - goal rate for TF will be Vital High Protein @ 15 ml/hr with 60 ml prostat TID which will provide 1440 kcal, 163 grams protein, and 702 ml free water.    NUTRITION DIAGNOSIS:   Inadequate oral intake related to inability to eat as evidenced by NPO status.  GOAL:   Patient will meet greater than or equal to 90% of their needs  MONITOR:   Vent status, TF tolerance, Labs, Weight trends  REASON FOR ASSESSMENT:   Ventilator, Consult Enteral/tube feeding initiation and management  ASSESSMENT:   63 year-old male with hx of HTN, migraines, and prostate cancer. He was admitted for prostatectomy on 5/29, and robotic-assisted lap radical prostatectomy with bilateral pelvic laminectomy. He was being discharged on 5/31 when he developed sudden onset of diaphoresis, dyspnea, and then loss of consciousness. Found to have PEA; had ROSC after about 30 minutes.   Patient was intubated following Code Blue d/t PEA arrest yesterday. OGT not documented in LDA avatar or flow sheet, but abd x-ray report from this AM states it is in place with tip and side port in the stomach.   Per notes: concern for hemorrhagic shock with acute blood loss anemia related to bleeding from surgical sites after thrombolytics, sepsis, acute respiratory failure in the setting of PEA arrest, goal MAP >65, AKI from ATN, metabolic acidosis with lactic acidosis, possible need for CRRT, acute metabolic encephalopathy.  Patient is currently intubated on ventilator support MV: 17.6 L/min Temp (24hrs), Avg:98.3 F (36.8 C), Min:97.3 F (36.3 C), Max:98.9 F (37.2 C) Propofol: none  Medications reviewed; 1 g IV Ca gluconate  x1 run 5/31, 50 mg solu-cortef QID, 10 mEq IV KCl x3 runs 5/31. Labs reviewed; CBGs: 95-318 mg/dl since midnight, BUN: 26 mg/dl, creatinine: 4.98 mg/dl, Ca: 6.9 mg/dl, GFR: 11 ml/min.  Drips; epi @ 20 mcg/min, fentanyl @ 50 mcg/hr, neo @160  mcg/min, levo @ 40 mcg/min, vaso 2 0.03 units/min.  IVF; NS @ 75 ml/hr + D5-150 mEq sodium bicarb @ 50 ml/hr (204 kcal).     NUTRITION - FOCUSED PHYSICAL EXAM:  unable to complete at this time  Diet Order:   Diet Order            Diet NPO time specified  Diet effective now        Diet - low sodium heart healthy        Diet - low sodium heart healthy              EDUCATION NEEDS:   Not appropriate for education at this time  Skin:  Skin Assessment: Reviewed RN Assessment  Last BM:  5/30  Height:   Ht Readings from Last 1 Encounters:  08/14/2018 6' (1.829 m)    Weight:   Wt Readings from Last 1 Encounters:  08/26/2018 106.1 kg    Ideal Body Weight:  80.9 kg  BMI:  Body mass index is 31.72 kg/m.  Estimated Nutritional Needs:   Kcal:  1610-9604 kcal  Protein:  >/= 162 grams  Fluid:  >/= 2 L/day     Jarome Matin, MS, RD, LDN, [facility] Inpatient Clinical Dietitian Pager # [phone] After hours/weekend pager # [phone]

## 2018-09-11 DEATH — deceased

## 2018-09-12 LAB — BLOOD GAS, ARTERIAL
Acid-base deficit: 14.5 mmol/L — ABNORMAL HIGH (ref 0.0–2.0)
Acid-base deficit: 11.6 mmol/L — ABNORMAL HIGH (ref 0.0–2.0)
Acid-base deficit: 10.8 mmol/L — ABNORMAL HIGH (ref 0.0–2.0)
Bicarbonate: 13.9 mmol/L — ABNORMAL LOW (ref 20.0–28.0)
Bicarbonate: 15.3 mmol/L — ABNORMAL LOW (ref 20.0–28.0)
Bicarbonate: 15.6 mmol/L — ABNORMAL LOW (ref 20.0–28.0)
Drawn by: 235321
Drawn by: 308601
Drawn by: 441261
FIO2: 100
FIO2: 100
FIO2: 100
MECHVT: 620 mL
MECHVT: 620 mL
MECHVT: 620 mL
O2 Saturation: 68.8 %
O2 Saturation: 83 %
O2 Saturation: 88.8 %
PEEP: 10 cmH2O
PEEP: 10 cmH2O
PEEP: 10 cmH2O
Patient temperature: 97.4
Patient temperature: 98
Patient temperature: 98.6
RATE: 28 resp/min
RATE: 28 resp/min
RATE: 28 resp/min
pCO2 arterial: 46 mmHg (ref 32.0–48.0)
pCO2 arterial: 41.3 mmHg (ref 32.0–48.0)
pCO2 arterial: 39.6 mmHg (ref 32.0–48.0)
pH, Arterial: 7.104 — CL (ref 7.350–7.450)
pH, Arterial: 7.193 — CL (ref 7.350–7.450)
pH, Arterial: 7.221 — ABNORMAL LOW (ref 7.350–7.450)
pO2, Arterial: 45.6 mmHg — ABNORMAL LOW (ref 83.0–108.0)
pO2, Arterial: 58.5 mmHg — ABNORMAL LOW (ref 83.0–108.0)
pO2, Arterial: 71.9 mmHg — ABNORMAL LOW (ref 83.0–108.0)

## 2018-09-12 LAB — CBC
HCT: 23 % — ABNORMAL LOW (ref 39.0–52.0)
HCT: 26 % — ABNORMAL LOW (ref 39.0–52.0)
Hemoglobin: 7.3 g/dL — ABNORMAL LOW (ref 13.0–17.0)
Hemoglobin: 8.1 g/dL — ABNORMAL LOW (ref 13.0–17.0)
MCH: 30.7 pg (ref 26.0–34.0)
MCH: 30.9 pg (ref 26.0–34.0)
MCHC: 31.2 g/dL (ref 30.0–36.0)
MCHC: 31.7 g/dL (ref 30.0–36.0)
MCV: 97.5 fL (ref 80.0–100.0)
MCV: 98.5 fL (ref 80.0–100.0)
Platelets: 46 10*3/uL — ABNORMAL LOW (ref 150–400)
Platelets: 49 10*3/uL — ABNORMAL LOW (ref 150–400)
RBC: 2.36 MIL/uL — ABNORMAL LOW (ref 4.22–5.81)
RBC: 2.64 MIL/uL — ABNORMAL LOW (ref 4.22–5.81)
RDW: 13.5 % (ref 11.5–15.5)
RDW: 13.6 % (ref 11.5–15.5)
WBC: 10 10*3/uL (ref 4.0–10.5)
WBC: 10.3 10*3/uL (ref 4.0–10.5)
nRBC: 1.4 % — ABNORMAL HIGH (ref 0.0–0.2)
nRBC: 1.7 % — ABNORMAL HIGH (ref 0.0–0.2)

## 2018-09-12 LAB — GLUCOSE, CAPILLARY
Glucose-Capillary: 179 mg/dL — ABNORMAL HIGH (ref 70–99)
Glucose-Capillary: 136 mg/dL — ABNORMAL HIGH (ref 70–99)
Glucose-Capillary: 21 mg/dL — CL (ref 70–99)
Glucose-Capillary: 263 mg/dL — ABNORMAL HIGH (ref 70–99)
Glucose-Capillary: 135 mg/dL — ABNORMAL HIGH (ref 70–99)

## 2018-09-12 LAB — COOXEMETRY PANEL
Carboxyhemoglobin: 0.2 % — ABNORMAL LOW (ref 0.5–1.5)
Carboxyhemoglobin: 0.4 % — ABNORMAL LOW (ref 0.5–1.5)
Methemoglobin: 2.1 % — ABNORMAL HIGH (ref 0.0–1.5)
Methemoglobin: 2.5 % — ABNORMAL HIGH (ref 0.0–1.5)
O2 Saturation: 51.3 %
O2 Saturation: 55.8 %
Total hemoglobin: 7.4 g/dL — ABNORMAL LOW (ref 12.0–16.0)
Total hemoglobin: 4.5 g/dL — CL (ref 12.0–16.0)

## 2018-09-12 LAB — COMPREHENSIVE METABOLIC PANEL
ALT: <5 U/L (ref 0–44)
AST: <5 U/L — ABNORMAL LOW (ref 15–41)
Albumin: 1.6 g/dL — ABNORMAL LOW (ref 3.5–5.0)
Alkaline Phosphatase: 231 U/L — ABNORMAL HIGH (ref 38–126)
Anion gap: 36 — ABNORMAL HIGH (ref 5–15)
BUN: 22 mg/dL (ref 8–23)
CO2: 14 mmol/L — ABNORMAL LOW (ref 22–32)
Calcium: 4.9 mg/dL — CL (ref 8.9–10.3)
Chloride: 89 mmol/L — ABNORMAL LOW (ref 98–111)
Creatinine, Ser: 4.09 mg/dL — ABNORMAL HIGH (ref 0.61–1.24)
GFR calc Af Amer: 17 mL/min — ABNORMAL LOW (ref >60)
GFR calc non Af Amer: 15 mL/min — ABNORMAL LOW (ref >60)
Glucose, Bld: 138 mg/dL — ABNORMAL HIGH (ref 70–99)
Potassium: 5.3 mmol/L — ABNORMAL HIGH (ref 3.5–5.1)
Sodium: 139 mmol/L (ref 135–145)
Total Bilirubin: 2 mg/dL — ABNORMAL HIGH (ref 0.3–1.2)
Total Protein: 3.4 g/dL — ABNORMAL LOW (ref 6.5–8.1)

## 2018-09-12 LAB — PREPARE FRESH FROZEN PLASMA: Unit division: 0

## 2018-09-12 LAB — BPAM FFP
Blood Product Expiration Date: 202006062359
ISSUE DATE / TIME: 202006011410
Unit Type and Rh: 6200

## 2018-09-12 LAB — DIC (DISSEMINATED INTRAVASCULAR COAGULATION)PANEL

## 2018-09-12 LAB — PREPARE RBC (CROSSMATCH)

## 2018-09-12 LAB — PHOSPHORUS: Phosphorus: 14 mg/dL — ABNORMAL HIGH (ref 2.5–4.6)

## 2018-09-12 LAB — MAGNESIUM: Magnesium: 2.8 mg/dL — ABNORMAL HIGH (ref 1.7–2.4)

## 2018-09-12 LAB — LACTIC ACID, PLASMA: Lactic Acid, Venous: >11 mmol/L (ref 0.5–1.9)

## 2018-09-12 MED ORDER — DEXTROSE 50 % IV SOLN
INTRAVENOUS | Status: AC
  Administered 2018-09-12: 50 mL
  Filled 2018-09-12: qty 100

## 2018-09-12 MED ORDER — SODIUM BICARBONATE 8.4 % IV SOLN
100.0000 meq | Freq: Once | INTRAVENOUS | Status: AC
  Administered 2018-09-12: 100 meq via INTRAVENOUS
  Filled 2018-09-12: qty 100

## 2018-09-12 MED ORDER — SODIUM CHLORIDE 0.9% FLUSH: 10.0000 mL | INTRAVENOUS | Status: DC | PRN

## 2018-09-12 MED ORDER — DOBUTAMINE IN D5W 4-5 MG/ML-% IV SOLN
5.0000 ug/kg/min | INTRAVENOUS | Status: DC
  Administered 2018-09-12: 5 ug/kg/min via INTRAVENOUS
  Filled 2018-09-12: qty 250

## 2018-09-12 MED ORDER — SODIUM BICARBONATE 8.4 % IV SOLN
100.0000 meq | Freq: Once | INTRAVENOUS | Status: AC
  Administered 2018-09-12: 100 meq via INTRAVENOUS

## 2018-09-12 MED ORDER — SODIUM BICARBONATE 8.4 % IV SOLN
INTRAVENOUS | Status: AC
  Filled 2018-09-12: qty 100

## 2018-09-12 MED ORDER — SODIUM CHLORIDE 0.9 % IV SOLN
0.0000 ug/min | INTRAVENOUS | Status: DC
  Administered 2018-09-12: 400 ug/min via INTRAVENOUS
  Filled 2018-09-12 (×2): qty 8

## 2018-09-12 MED ORDER — SODIUM CHLORIDE 0.9% FLUSH: 10.0000 mL | Freq: Two times a day (BID) | INTRAVENOUS | Status: DC

## 2018-09-12 MED ORDER — CHLORHEXIDINE GLUCONATE CLOTH 2 % EX PADS: 6.0000 | MEDICATED_PAD | Freq: Every day | CUTANEOUS | Status: DC

## 2018-09-12 MED ORDER — EPINEPHRINE PF 1 MG/ML IJ SOLN
0.5000 ug/min | INTRAVENOUS | Status: DC
  Administered 2018-09-12: 20 ug/min via INTRAVENOUS
  Filled 2018-09-12 (×2): qty 8

## 2018-09-12 MED ORDER — SODIUM CHLORIDE 0.9% IV SOLUTION: Freq: Once | INTRAVENOUS | Status: DC

## 2018-09-12 MED ORDER — CALCIUM GLUCONATE-NACL 1-0.675 GM/50ML-% IV SOLN
1.0000 g | Freq: Once | INTRAVENOUS | Status: AC
  Administered 2018-09-12: 1000 mg via INTRAVENOUS
  Filled 2018-09-12: qty 50

## 2018-09-12 MED FILL — Medication: Qty: 1 | Status: AC

## 2018-09-13 LAB — TYPE AND SCREEN
ABO/RH(D): A POS
Antibody Screen: NEGATIVE
Unit division: 0
Unit division: 0
Unit division: 0
Unit division: 0
Unit division: 0

## 2018-09-13 LAB — BPAM RBC
Blood Product Expiration Date: 202006122359
Blood Product Expiration Date: 202006122359
Blood Product Expiration Date: 202006122359
Blood Product Expiration Date: 202006182359
Blood Product Expiration Date: 202006182359
ISSUE DATE / TIME: 202006011105
ISSUE DATE / TIME: 202006011410
ISSUE DATE / TIME: 202006011944
ISSUE DATE / TIME: 202006020837
Unit Type and Rh: 6200
Unit Type and Rh: 6200
Unit Type and Rh: 6200
Unit Type and Rh: 6200
Unit Type and Rh: 6200

## 2018-09-16 LAB — CULTURE, BLOOD (ROUTINE X 2)
Culture: NO GROWTH
Culture: NO GROWTH
Special Requests: ADEQUATE
Special Requests: ADEQUATE

## 2018-09-19 ENCOUNTER — Telehealth: Payer: Self-pay

## 2018-09-19 NOTE — Telephone Encounter (Signed)
Received dc from [facility].   DC is for burial and a patient of Doctor Sood.   DC will be taken to [facility] ICU for signature.  On 09/20/2018 Received dc back from Doctor Halford Chessman, I called the funeral home to let them know the dc was mailed to vital records per the funeral home request.

## 2018-10-11 NOTE — Progress Notes (Signed)
CRITICAL VALUE ALERT  Critical Value: hgb=7.3, Ca=4.9 Phos=14, coox =51, ABG= 7.19/42/15.3/60  Date & Time Notied:  0500 Provider Notified: yes  Orders Received/Actions taken: Yes

## 2018-10-11 NOTE — Progress Notes (Signed)
At time of shift change patient's status continues to decline. Wife, Baker Janus called, RN requested wife come ASAP due to patient's status. Wife and Wive's sister at bedside. BP 61/10. Informed Family that likely patient's heart will stop soon. Emergent blood running, Returned blood from CRRT, Charge Nurse at bedside, paddles on patient, Heart Rate rhythm change, Pulses weak,MD talking with wife. Code Blue called. Started CPR with family at bedside.   Time of death 49  Eye prep completed.   Wasted 50cc of Fentanyl with Zoe Suggs.   Condolences given to family.

## 2018-10-11 NOTE — Progress Notes (Signed)
[place] Progress Note Patient Name: Kevin Potts DOB: 07/14/1955 MRN: [id number]   Date of Service  Sep 29, 2018  HPI/Events of Note  Spoke with patient's wife, Kevin Potts, about the tenuous status of her husband who has deteriorated over the last 24 hours. I informed her that we are at the limits of our ability to support him. He is on 3 vasopressors, mechanical ventilation, a continuous NaHCO3 IV infusion and CRRT. She voices understanding of the situation, however, she would not commit to a DNR status. She would like to speak with the physicians in the morning concerning her husband.  eICU Interventions  Continue current management.      Intervention Category Major Interventions: End of life / care limitation discussion  Lysle Dingwall 09-29-18, 4:45 AM

## 2018-10-11 NOTE — Progress Notes (Signed)
Unable to obtain an accurate blood pressure from cuff. Multiple unsuccessful attempts by RT to place a radial arterial line. Md was also not successful in placing a femoral aline.

## 2018-10-11 NOTE — Progress Notes (Signed)
See code sheet for further medications given during code.

## 2018-10-11 NOTE — Progress Notes (Signed)
Patient developed cardiac arrest with PEA.  Discussed with wife at bedside.  She confirmed that CPR should not be done.  Time of death 9:13 AM.  [name], MD [facility] Pulmonary/Critical Care October 09, 2018, 9:20 AM

## 2018-10-11 NOTE — Progress Notes (Signed)
[place] Progress Note Patient Name: Kevin Potts DOB: [birth year] MRN: [id number]   Date of Service  2018/09/18  HPI/Events of Note  Multiple issues: 1. Hypotension/Shock - COOX = 51.3% Hgb = 7.3 . Femoral CVL, therefore, no CVP possible and 2. ABG on 100%/PRVC 28/TV 620/P 10 = 7.18/42/60/15.3 .  eICU Interventions  Will order: 1. Dobutamine IV infusion at 5 mcg/kg/min.  2. Repeat COOX at 7:15 PM 3. NaHCO3 100 meq IV now.  4. Repeat ABG at 7:15 AM.     Intervention Category Major Interventions: Shock - evaluation and management;Hypotension - evaluation and management  Sommer,Steven Cornelia Copa 2018-09-18, 5:19 AM

## 2018-10-11 NOTE — Progress Notes (Signed)
Spoke with pt's wife at bedside.  Explained severity of situation and that he is not likely to survive.  Plan is to continue current therapies.  However, if he develops cardiac arrest, then no resuscitation.    [name], MD [facility] Pulmonary/Critical Care 11-Oct-2018, 9:05 AM

## 2018-10-11 NOTE — Progress Notes (Signed)
[place] Progress Note Patient Name: Kenyatte Chatmon DOB: February 13, 1956 MRN: [id number]   Date of Service  09/26/2018  HPI/Events of Note  Hypotension - BP = 42/11. Currently on Norepinephrine, Epinephrine, Vasopressin and Sodium Bicarb IV infusions at ceiling doses.   eICU Interventions  Will order: 1. ABG STAT. 2. Increase ceiling on NaHCO3 IV infusion to 70 mcg/min. 3. NaHCO3 100 meq IV post ABG.      Intervention Category Major Interventions: Acid-Base disturbance - evaluation and management;Respiratory failure - evaluation and management;Hypotension - evaluation and management  Keneshia Tena Eugene 2018-09-26, 2:06 AM

## 2018-10-11 NOTE — Progress Notes (Signed)
4 Days Post-Op   Subjective/Chief Complaint:   1 - Acute CV Collapse - PEA arrest late AM POD 2 (5/31) just prior to planned discharge following uncomplicated prostatectomy. Presumed PE and given thrombolytics / anticoagulation. CPR/Presors and ICU transfer with intubation. Worsening metabolic derangement 6/1 and begin CRRT via Rt neck line and Rt groin A-Line placed. 6/2 continued worsening oxygenation, metabolic derengement on max 3 pressors and CRRT.   2 - Prostate Cancer - s/p uncomplicated robotic prostatectomy with node diessection 09/02/2018, day of admission. JP removed as output scant. Path pending.   3 - Acute Renal Failure - Cr 4.9 6/1 with decrease UOP after PEA arrest 5/31. On high dose pressors. Baseline Cr <1.3. CRRT started 6/1.  Today "Siddharth" is worsening. Continued actic acidosis and shock on multipressors.    Objective: Vital signs in last 24 hours: Temp:  [94 F (34.4 C)-98.4 F (36.9 C)] 97.4 F (36.3 C) (06/02 0245) Pulse Rate:  [55-118] 114 (06/02 0230) Resp:  [20-33] 26 (06/02 0230) BP: (58-169)/(13-135) 90/31 (06/02 0230) SpO2:  [81 %-100 %] 90 % (06/02 0230) FiO2 (%):  [50 %-100 %] 100 % (06/02 0321) Last BM Date: 09/09/18  Intake/Output from previous day: 06/01 0701 - 06/02 0700 In: 13511.2 [P.O.:100; I.V.:11952.8; Blood:978; IV Piggyback:480.4] Out: 2202 [Urine:100] Intake/Output this shift: No intake/output data recorded.  General appearance: obtunded in ICU. increased edema.  Eyes: negative Nose: Nares normal. Septum midline. Mucosa normal. No drainage or sinus tenderness. Throat: ETT in place on vent Neck: Rt CRRT line in place w/o hematomas connected to CRRT.  Back: symmetric, no curvature. ROM normal. No CVA tenderness. Resp: more wet sounding on vent.  Chest wall: no tenderness Cardio: HR low 100s and regular by bedside monitor, SBP low 802. GI: soft, non-tender; bowel sounds normal; no masses,  no organomegaly Male genitalia: worsened  scrotal edema and mild penoscrotal ecchymoses. Foley in place wtih some muddy urine that is non-clotted.  Extremities: increasing edema. Rt groin A-Line w/o hematomas.  Skin: Skin color, texture, turgor normal. No rashes or lesions Lymph nodes: Cervical, supraclavicular, and axillary nodes normal. Neurologic: Mental status: Sedated, now minimally responsive Incision/Wound: recent port sites with small echhymoses, no active cutaneous bleed.   Lab Results:  Recent Labs    09/11/18 2357 20-Sep-2018 0338  WBC 10.3 10.0  HGB 8.1* 7.3*  HCT 26.0* 23.0*  PLT 49* 46*   BMET Recent Labs    09/11/18 1630 09-20-18 0338  NA 139 139  K 5.5* 5.3*  CL 101 89*  CO2 13* 14*  GLUCOSE 177* 138*  BUN 26* 22  CREATININE 4.72* 4.09*  CALCIUM 5.1* 4.9*   PT/INR Recent Labs    09/10/18 1018 09/11/18 0308  LABPROT 24.6* 34.0*  INR 2.3* 3.4*   ABG Recent Labs    Sep 20, 2018 0247 20-Sep-2018 0445  PHART 7.104* 7.193*  HCO3 13.9* 15.3*    Studies/Results: Dg Abd 1 View  Result Date: 09/11/2018 CLINICAL DATA:  Orogastric tube placement EXAM: ABDOMEN - 1 VIEW COMPARISON:  None. FINDINGS: Orogastric tube tip and side port are in the stomach. Moderate air noted in stomach. Visualized bowel loops otherwise appear unremarkable. No free air evident. Atelectatic change noted in the left base. Lung bases elsewhere clear. IMPRESSION: Orogastric tube tip and side port in stomach. Moderate air in stomach. Loops appear otherwise unremarkable. No free air evident. There is atelectatic change in the left lung base. Lung bases otherwise are clear. Visualized bowel otherwise grossly unremarkable. No free air. Left  base atelectasis. Electronically Signed   By: Lowella Grip III M.D.   On: 09/11/2018 10:03   US Renal  Result Date: 09/11/2018 CLINICAL DATA:  Acute kidney injury EXAM: RENAL / URINARY TRACT ULTRASOUND COMPLETE COMPARISON:  None FINDINGS: Right Kidney: Renal measurements: 12.3 x 5.5 x 5.8 cm = volume:  205 mL. Cortical thinning. Increased cortical echogenicity. No mass, hydronephrosis or shadowing calcification. Left Kidney: Renal measurements: 12.9 x 5.1 x 4.8 cm = volume: 164 mL. Suboptimal visualization due to body habitus. Cortical thinning. No gross mass or hydronephrosis identified on limited assessment. Bladder: Appears normal for degree of bladder distention. Small amount of ascites is noted in RIGHT upper quadrant. IMPRESSION: Renal cortical atrophy and suspected medical renal disease changes. No gross evidence of renal mass or hydronephrosis identified though assessment of the LEFT kidney is suboptimal. Small volume ascites RIGHT upper quadrant. Electronically Signed   By: Lavonia Dana M.D.   On: 09/11/2018 18:32   Dg Chest Port 1 View  Result Date: 09/11/2018 CLINICAL DATA:  Central line placement. EXAM: PORTABLE CHEST 1 VIEW COMPARISON:  Radiographs 09/11/2018 and 09/10/2018 FINDINGS: 1219 hours. New right IJ central venous catheter projects to the mid SVC level. The endotracheal and enteric tubes are unchanged in position. There is no pneumothorax. Mild atelectasis at both lung bases appears slightly worse. The heart size and mediastinal contours are stable. No acute osseous findings are seen. IMPRESSION: Right IJ central venous catheter projects to the mid SVC level. No pneumothorax. Mildly progressive bibasilar atelectasis. Electronically Signed   By: Richardean Sale M.D.   On: 09/11/2018 12:42   Dg Chest Port 1 View  Result Date: 09/11/2018 CLINICAL DATA:  Respiratory failure. EXAM: PORTABLE CHEST 1 VIEW COMPARISON:  Chest x-ray from yesterday. FINDINGS: Unchanged endotracheal and enteric tubes. Stable cardiomediastinal silhouette. Low lung volumes. Mild left basilar atelectasis. No focal consolidation, pleural effusion, or pneumothorax. No acute osseous abnormality. IMPRESSION: Unchanged endotracheal and enteric tubes.  No active disease. Electronically Signed   By: Titus Dubin M.D.   On:  09/11/2018 09:37   Dg Chest Port 1 View  Result Date: 09/10/2018 CLINICAL DATA:  Status post intubation. EXAM: PORTABLE CHEST 1 VIEW COMPARISON:  None. FINDINGS: Endotracheal tube is in place with the tip in good position at the level of the clavicular heads, 5 cm above the carina. Lungs are clear. Heart size is normal. No pneumothorax or pleural fluid. Defibrillator pads noted. IMPRESSION: ETT in good position.  Lungs are clear. Electronically Signed   By: Inge Rise M.D.   On: 09/10/2018 11:07   Vas [place] Lower Extremity Venous (dvt)  Result Date: 09/10/2018  Lower Venous Study Indications: Edema.  Risk Factors: Prostate cancer. Recent prostatectomy. PEA arrest while dressing for discharge. Limitations: Body habitus and edema. Comparison Study: No prior study on file for comparison Performing Technologist: Abram Sander RVS  Examination Guidelines: A complete evaluation includes B-mode imaging, spectral Doppler, color Doppler, and power Doppler as needed of all accessible portions of each vessel. Bilateral testing is considered an integral part of a complete examination. Limited examinations for reoccurring indications may be performed as noted.  +---------+---------------+---------+-----------+----------+--------------+ RIGHT    CompressibilityPhasicitySpontaneityPropertiesSummary        +---------+---------------+---------+-----------+----------+--------------+ CFV      Full           Yes      Yes                                 +---------+---------------+---------+-----------+----------+--------------+  SFJ      Full                                                        +---------+---------------+---------+-----------+----------+--------------+ FV Prox  Full                                                        +---------+---------------+---------+-----------+----------+--------------+ FV Mid   Full                                                         +---------+---------------+---------+-----------+----------+--------------+ FV DistalFull                                                        +---------+---------------+---------+-----------+----------+--------------+ PFV      Full                                                        +---------+---------------+---------+-----------+----------+--------------+ POP      Full           Yes      Yes                                 +---------+---------------+---------+-----------+----------+--------------+ PTV      Full                                                        +---------+---------------+---------+-----------+----------+--------------+ PERO                                                  Not visualized +---------+---------------+---------+-----------+----------+--------------+   +---------+---------------+---------+-----------+----------+--------------+ LEFT     CompressibilityPhasicitySpontaneityPropertiesSummary        +---------+---------------+---------+-----------+----------+--------------+ CFV      Full           Yes      Yes                                 +---------+---------------+---------+-----------+----------+--------------+ SFJ      Full                                                        +---------+---------------+---------+-----------+----------+--------------+  FV Prox  Full                                                        +---------+---------------+---------+-----------+----------+--------------+ FV Mid   Full                                                        +---------+---------------+---------+-----------+----------+--------------+ FV DistalFull                                                        +---------+---------------+---------+-----------+----------+--------------+ PFV      Full                                                         +---------+---------------+---------+-----------+----------+--------------+ POP      Full           Yes      Yes                                 +---------+---------------+---------+-----------+----------+--------------+ PTV      Full                                                        +---------+---------------+---------+-----------+----------+--------------+ PERO                                                  Not visualized +---------+---------------+---------+-----------+----------+--------------+     Summary: Right: There is no evidence of deep vein thrombosis in the lower extremity. However, portions of this examination were limited- see technologist comments above. Left: There is no evidence of deep vein thrombosis in the lower extremity. However, portions of this examination were limited- see technologist comments above.  *See table(s) above for measurements and observations.    Preliminary     Anti-infectives: Anti-infectives (From admission, onward)   Start     Dose/Rate Route Frequency Ordered Stop   October 03, 2018 0600  vancomycin (VANCOCIN) 1,250 mg in sodium chloride 0.9 % 250 mL IVPB  Status:  Discontinued     1,250 mg 166.7 mL/hr over 90 Minutes Intravenous Every 24 hours 09/11/18 0315 09/11/18 0534   09/11/18 1800  piperacillin-tazobactam (ZOSYN) IVPB 3.375 g     3.375 g 100 mL/hr over 30 Minutes Intravenous Every 6 hours 09/11/18 1231     09/11/18 1200  piperacillin-tazobactam (ZOSYN) IVPB 2.25 g  Status:  Discontinued     2.25 g 100 mL/hr over 30 Minutes Intravenous Every 6 hours  09/11/18 0834 09/11/18 1231   09/11/18 0535  vancomycin variable dose per unstable renal function (pharmacist dosing)  Status:  Discontinued      Does not apply See admin instructions 09/11/18 0535 09/11/18 0901   09/11/18 0330  piperacillin-tazobactam (ZOSYN) IVPB 3.375 g  Status:  Discontinued     3.375 g 12.5 mL/hr over 240 Minutes Intravenous Every 8 hours 09/11/18 0303 09/11/18  0834   09/11/18 0330  vancomycin (VANCOCIN) 2,000 mg in sodium chloride 0.9 % 500 mL IVPB     2,000 mg 250 mL/hr over 120 Minutes Intravenous  Once 09/11/18 0303 09/11/18 0810   09/06/2018 0534  ceFAZolin (ANCEF) IVPB 2g/100 mL premix     2 g 200 mL/hr over 30 Minutes Intravenous 30 min pre-op 09/04/2018 0534 08/30/2018 0739   08/30/2018 0000  sulfamethoxazole-trimethoprim (BACTRIM DS) 800-160 MG tablet     1 tablet Oral 2 times daily 08/16/2018 0736        Assessment/Plan:  1 - Acute CV Collapse - agree with current pre-emptive PE treatment, pending additional eval. Continue to  appreciate Critical care team efforts given family goals of non-DNR yet. Prognosis very guarded. I am happy to participate in any family / goals of care conversations at anytime.   2 - Prostate Cancer -  No further cancer directed care.   3 - Acute Renal Failure - appreciate nephrology team and CRRT management.   Alexis Frock 09-14-18

## 2018-10-11 NOTE — Progress Notes (Signed)
NAME:  Kevin Potts, MRN:  [id number], DOB:  02-Jul-1955, LOS: 2 ADMISSION DATE:  09/02/2018, CONSULTATION DATE:  09/10/2018 REFERRING MD:  Dr. Lovena Neighbours, Urology, CHIEF COMPLAINT:  Cardiac arrest   Brief History   63 yo male admitted for prostatectomy on 5/29, and robotic assisted laparoscopic radical prostatectomy with b/l pelvic laminectomy.  Was to be discharged home 5/31 when he developed sudden onset of diaphoresis, dyspnea, and then loss of consciousness.  Found to have PEA.  Empirically treated with thrombolytic.  Had ROSC after about 30 minutes.  Past Medical History  Hypertension, Migraine HA, Diverticulosis  Significant Hospital Events   5/29 Admit 5/31 Cardiac arrest, TNK given 6/01 on multiple pressors, renal consulted, transfuse PRBC and FFP, started CRRT 6/02 add dobutamine, transfuse PRBC  Consults:  Urology Nephrology  Procedures:  ETT 5/31 >>  Rt femoral CVL 6/01 >> Rt IJ HD catheter 6/01 >>   Significant Diagnostic Tests:  Echo 5/31 >> EF greater than 65%, mild LVH, mild RV systolic dysfx, mildly enlarged RV cavity, dilated IVC Doppler legs b/l 5/31 >> no DVT  Micro Data:  Blood 5/31 >>   Antimicrobials:   Zosyn 5/31 >>  Vancomycin 5/31 >>   Interim history/subjective:  On multiple pressors, dobutamine, bicarbonate, vent, CRRT.  Objective   Blood pressure (!) 90/31, pulse (!) 114, temperature (!) 97.4 F (36.3 C), temperature source Oral, resp. rate (!) 26, height 6' (1.829 m), weight 106.1 kg, SpO2 90 %.    Vent Mode: PRVC FiO2 (%):  [50 %-100 %] 100 % Set Rate:  [24 bmp-28 bmp] 28 bmp Vt Set:  [620 mL] 620 mL PEEP:  [5 cmH20-10 cmH20] 10 cmH20 Plateau Pressure:  [18 cmH20-24 cmH20] 24 cmH20   Intake/Output Summary (Last 24 hours) at October 02, 2018 0750 Last data filed at 10-02-2018 0600 Gross per 24 hour  Intake 13511.23 ml  Output 1638 ml  Net 11873.23 ml   Filed Weights   08/14/2018 0611 08/28/2018 1200  Weight: 103 kg 106.1 kg    Examination:   General - on vent Eyes - pupils dilated and weakly reactive ENT - ETT in place Cardiac - regular rate/rhythm, no murmur Chest - decreased BS, scattered rhonchi, no wheeze Abdomen - distended, increased tympany, decreased bowel sounds Extremities - 1+ edema Skin - decreased capillary refill in hands and feet Neuro - opens eyes with stimulation, no following commands GU - catheter in place, scrotal edema   Resolved Hospital Problem list     Assessment & Plan:   Shock. Discussion: Initial concern for acute PE leading to cardiac arrest tx with thrombolytic.  Now concerned about hemorrhagic shock with intraabdominal bleeding after thrombolysis +/- intraabdominal sepsis with ischemic bowel. Plan - hold further anticoagulation for now - pressors to keep MAP > 60 - continue dobutamine and f/u cooximetry - continue stress dose solu cortef - day 2 of ABx  Acute blood loss anemia with hemorrhagic shock. Coagulopathy. Plan - transfuse for Hb < 7 - f/u CBC, PT/INR, Fibrinogen, PTT  Acute respiratory failure in setting of PEA arrest. Plan - full vent support - f/u CXR, ABG  Acute kidney injury from ATN. Metabolic acidosis with lactic acidosis. Hyperkalemia from acidosis. Hypocalcemia. Plan - continue CRRT - f/u ABG, BMET - continue HCO3 in IV fluid  S/p prostatectomy. Plan - post op care per urology - likely will need CT imaging of abdomen/pelvis when more stable  Acute metabolic encephalopathy. Hx of migraine headaches. Plan - RASS goal 0 - hold outpt  topamax  Hyperglycemia. Plan - SSI  Best practice:  Diet: trickle tube feeds DVT prophylaxis: SCDs GI prophylaxis: Protonix Mobility: bed rest Code Status: full code Disposition: ICU  Labs    CMP Latest Ref Rng & Units 2018-10-12 09/11/2018 09/11/2018  Glucose 70 - 99 mg/dL 138(H) 177(H) 345(H)  BUN 8 - 23 mg/dL 22 26(H) 26(H)  Creatinine 0.61 - 1.24 mg/dL 4.09(H) 4.72(H) 4.98(H)  Sodium 135 - 145 mmol/L 139 139  141  Potassium 3.5 - 5.1 mmol/L 5.3(H) 5.5(H) 4.9  Chloride 98 - 111 mmol/L 89(L) 101 108  CO2 22 - 32 mmol/L 14(L) 13(L) 10(L)  Calcium 8.9 - 10.3 mg/dL 4.9(LL) 5.1(LL) 6.9(L)  Total Protein 6.5 - 8.1 g/dL 3.4(L) - -  Total Bilirubin 0.3 - 1.2 mg/dL 2.0(H) - -  Alkaline Phos 38 - 126 U/L 231(H) - -  AST 15 - 41 U/L <5(L) - -  ALT 0 - 44 U/L <5 - -   CBC Latest Ref Rng & Units 10-12-2018 09/11/2018 09/11/2018  WBC 4.0 - 10.5 K/uL 10.0 10.3 -  Hemoglobin 13.0 - 17.0 g/dL 7.3(L) 8.1(L) 6.6(LL)  Hematocrit 39.0 - 52.0 % 23.0(L) 26.0(L) 21.3(L)  Platelets 150 - 400 K/uL 46(L) 49(L) -   ABG    Component Value Date/Time   PHART 7.193 (LL) 12-Oct-2018 0445   PCO2ART 41.3 10/12/18 0445   PO2ART 58.5 (L) 12-Oct-2018 0445   HCO3 15.3 (L) 10-12-2018 0445   ACIDBASEDEF 11.6 (H) 10-12-2018 0445   O2SAT 51.3 October 12, 2018 0448   CBG (last 3)  Recent Labs    09/11/18 2331 10/12/2018 0337 10-12-18 0736  GLUCAP 179* 136* 21*    CC time 37 minutes  [name], MD [place] 10/12/2018, 7:50 AM

## 2018-10-11 NOTE — Progress Notes (Signed)
   09-30-2018 0900  Clinical Encounter Type  Visited With Family  Visit Type Initial;Psychological support;Spiritual support;Code;Death  Referral From Nurse  Consult/Referral To Chaplain  Spiritual Encounters  Spiritual Needs Prayer;Emotional;Grief support;Other (Comment) (Spiritual Care Conversation/Support)  Stress Factors  Patient Stress Factors Not reviewed  Family Stress Factors Loss   I provided Spiritual Care support for the patient's wife, sister-in-law and 2 sons while the staff were coding the patient. The family requested prayer during the code and then we prayed at the bedside once Mr. Pustejovsky had passed.   Chaplain Shanon Ace M.Div., [facility]

## 2018-10-11 NOTE — Death Summary Note (Signed)
Kevin Potts was a 63 y.o. male admitted on 08/20/2018 for prostatectomy on 5/29, and robotic assisted laparoscopic radical prostatectomy with b/l pelvic laminectomy.  Was to be discharged home 5/31 when he developed sudden onset of diaphoresis, dyspnea, and then loss of consciousness.  Found to have PEA.  Empirically treated with thrombolytic.  Had ROSC after about 30 minutes.  Developed hemorrhagic shock requiring multiple transfusions and pressors.  Treated empirically with antibiotics.  Started on CRRT.  Clinically status continued to get worse.  D/w family and decision made for DNR status.  He developed asystolic cardiac arrest and expired on 09/21/2018 at 913.  Final diagnoses: Acute pulmonary embolism  Hemorrhagic shock Acute hypoxic respiratory failure Cardiac arrest Ischemic bowel Septic shock Acute blood loss anemia Coagulopathy Acute kidney injury with ATN Metabolic acidosis with lactic acidosis Hyperkalemia Hypocalcemia Prostate cancer s/p prostatectomy Acute metabolic encephalopathy Hyperglycemia  [name], MD [facility] Pulmonary/Critical Care 09/27/2018, 1:53 PM

## 2018-10-11 DEATH — deceased

## 2021-01-10 IMAGING — DX PORTABLE CHEST - 1 VIEW
1 series · 1 of 1 positions shown · non-contrast
Comparison: Radiographs 09/11/2018 and 09/10/2018

CLINICAL DATA: Central line placement.

EXAM:
PORTABLE CHEST 1 VIEW

[chest ap]
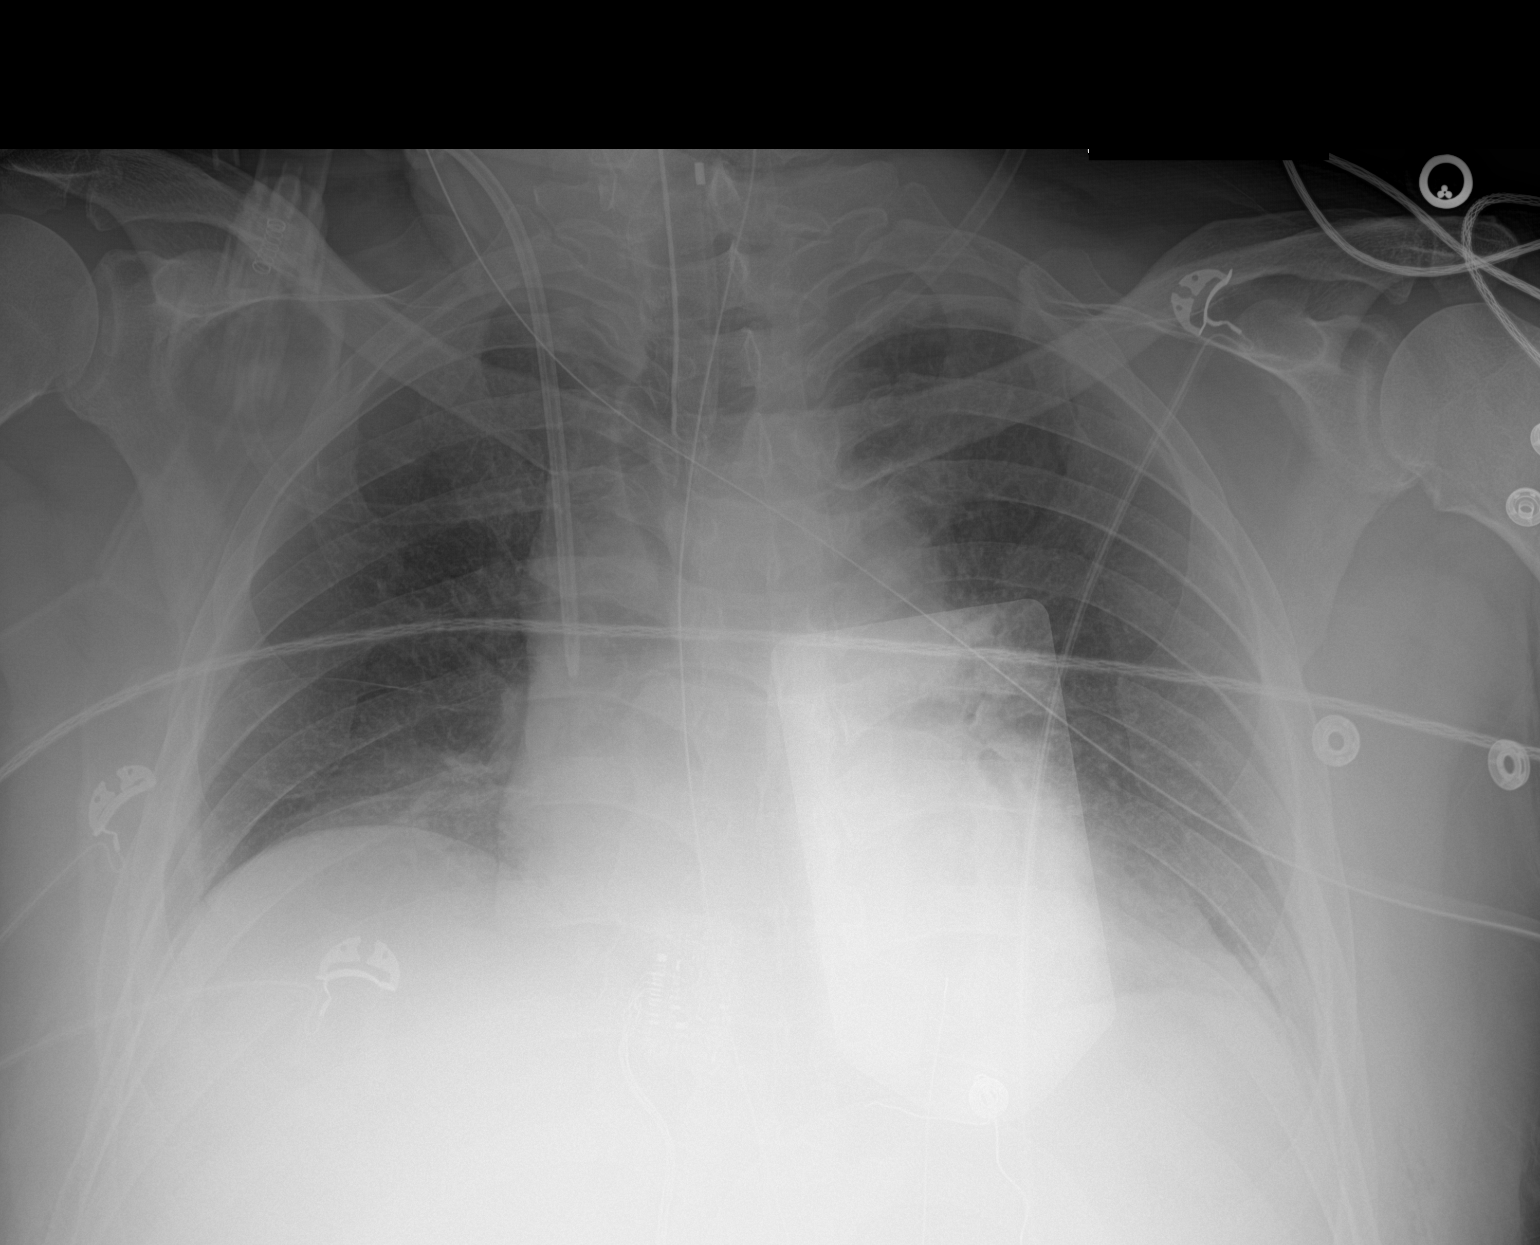

[1 of 1 positions shown; findings below may reference images not displayed]

FINDINGS: 1518 hours. New right IJ central venous catheter projects to the mid
SVC level. The endotracheal and enteric tubes are unchanged in
position. There is no pneumothorax. Mild atelectasis at both lung
bases appears slightly worse. The heart size and mediastinal
contours are stable. No acute osseous findings are seen.
IMPRESSION: Right IJ central venous catheter projects to the mid SVC level. No
pneumothorax. Mildly progressive bibasilar atelectasis.

## 2021-01-10 IMAGING — US US RENAL
1 series · 14 of 25 positions shown · non-contrast
Comparison: None

CLINICAL DATA: Acute kidney injury

EXAM:
RENAL / URINARY TRACT ULTRASOUND COMPLETE

[Series 1: us renal · 14 of 37 slices shown]
[im 1/37]
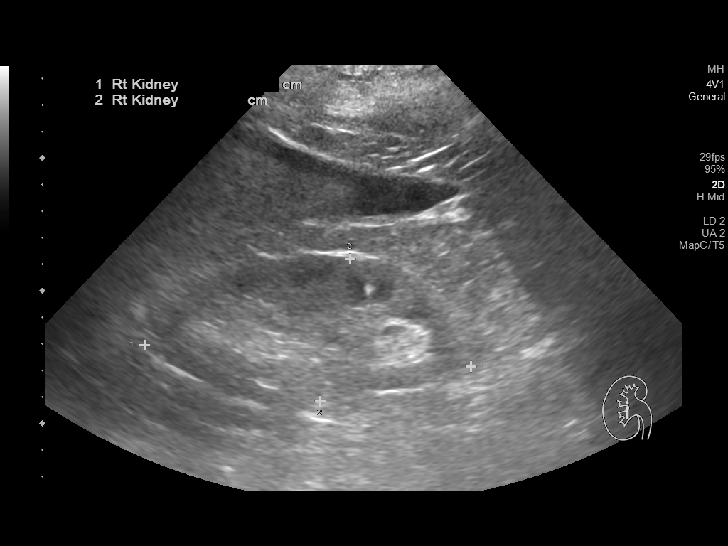
[im 4/37]
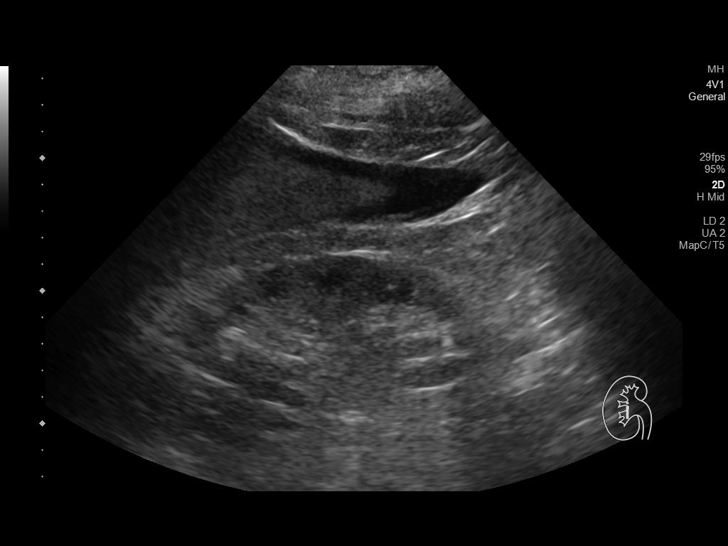
[im 7/37]
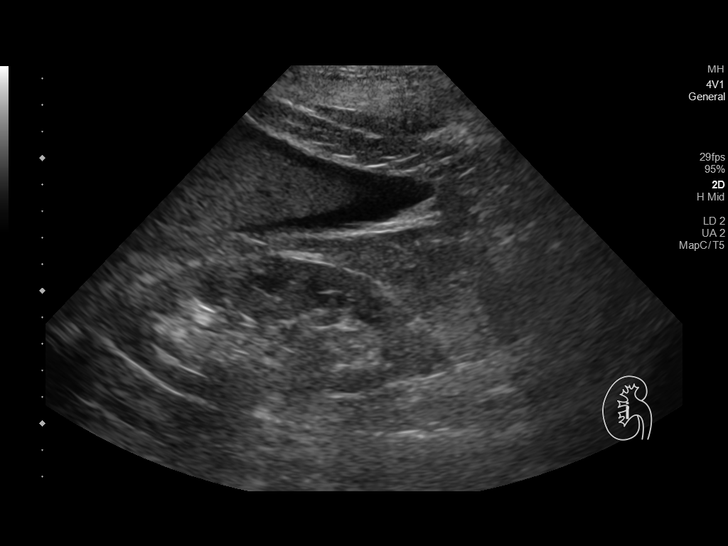
[im 10/37]
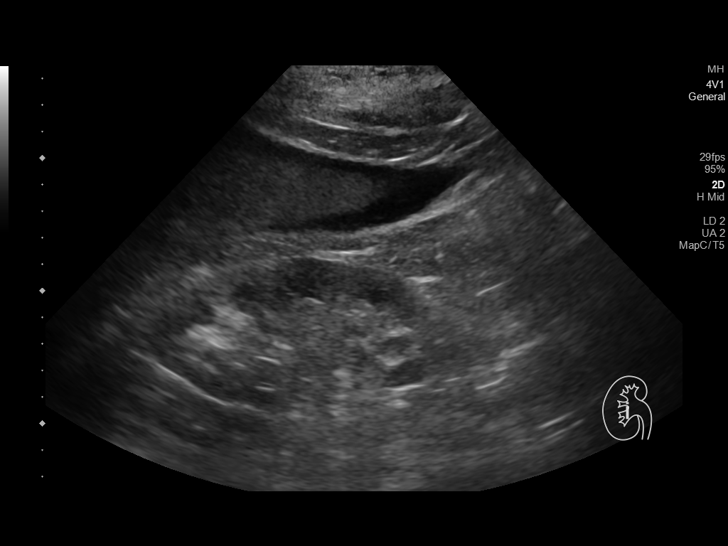
[im 13/37]
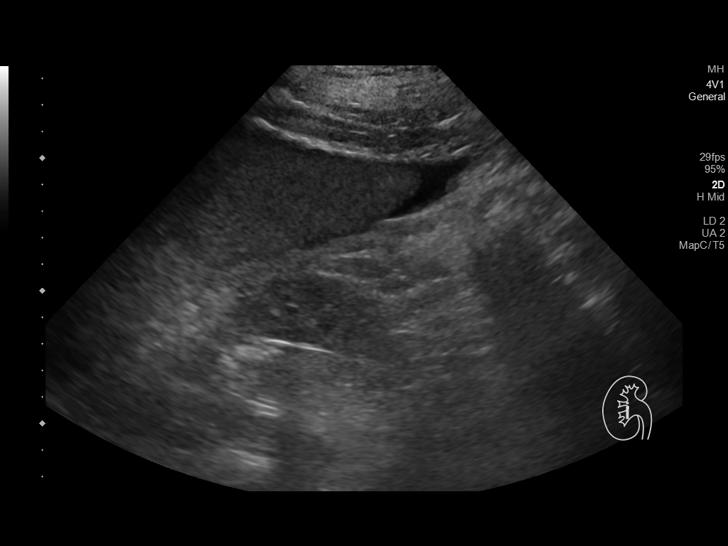
[im 14/37]
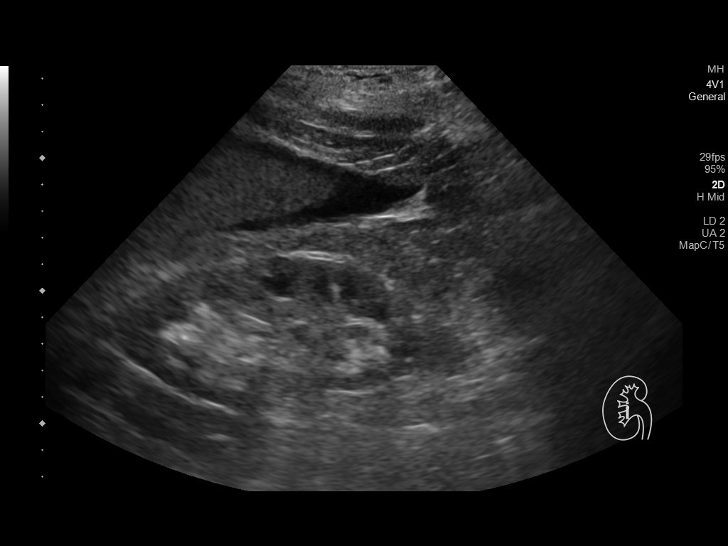
[im 17/37]
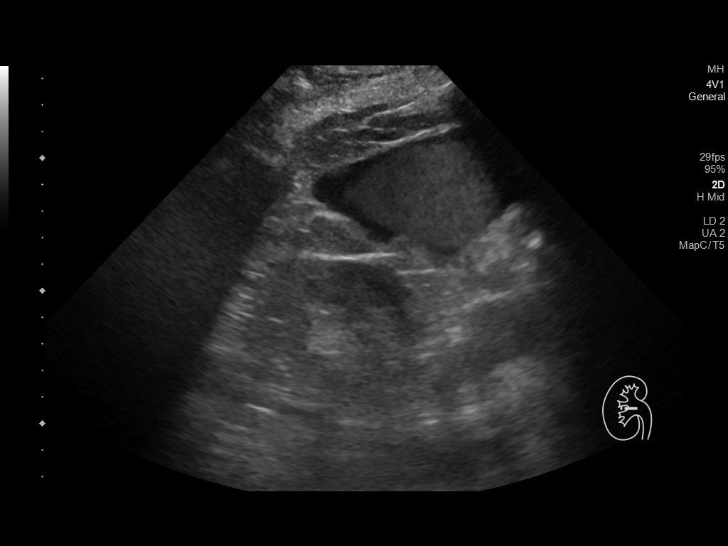
[im 20/37]
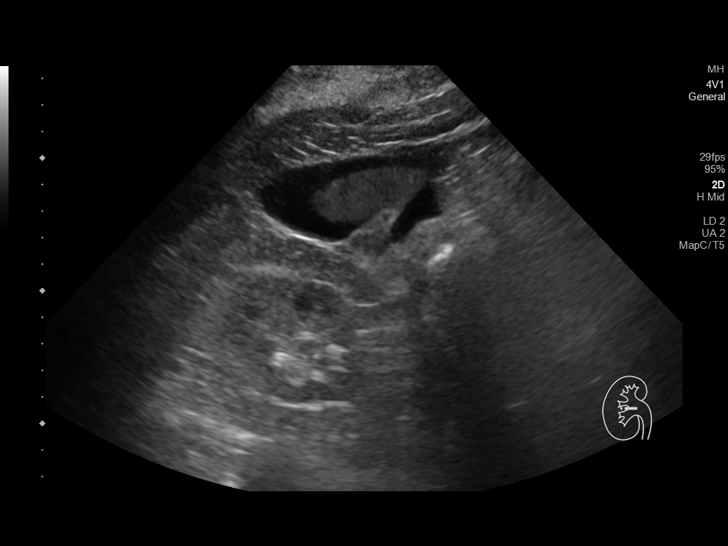
[im 23/37]
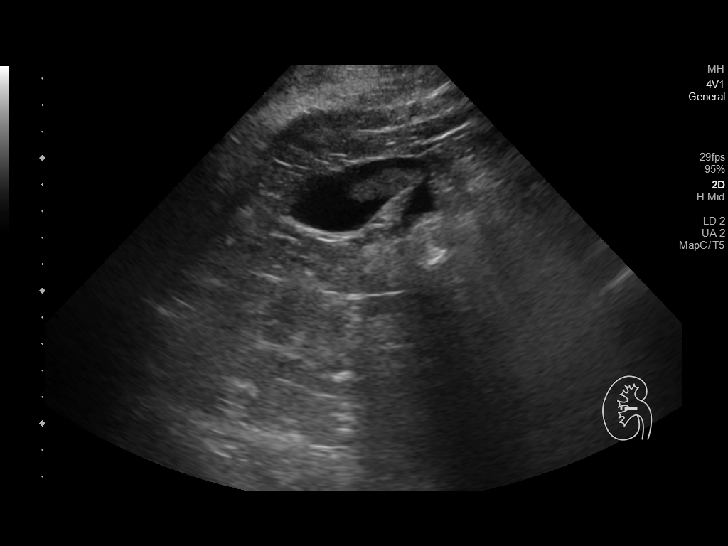
[im 25/37]
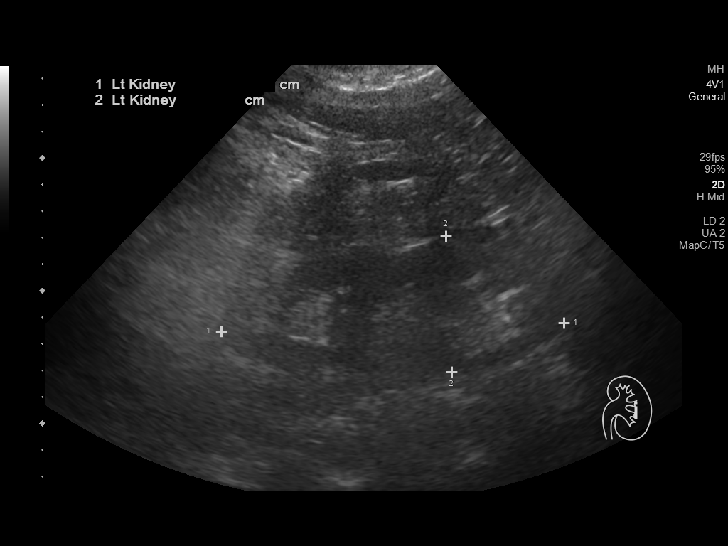
[im 28/37]
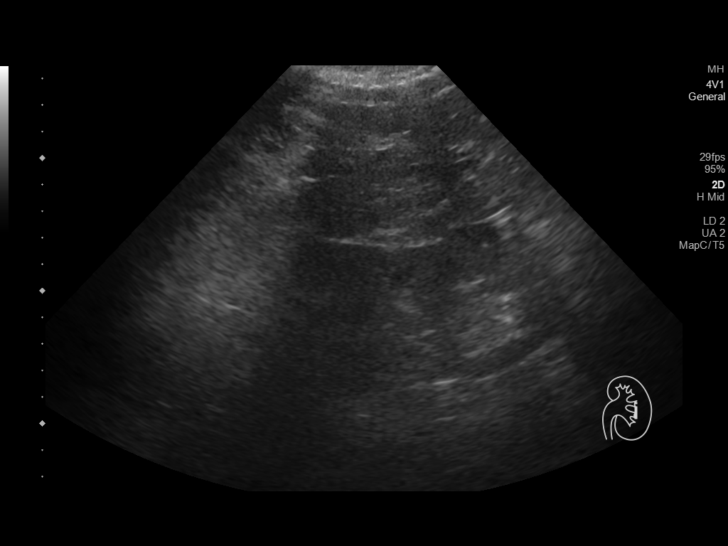
[im 31/37]
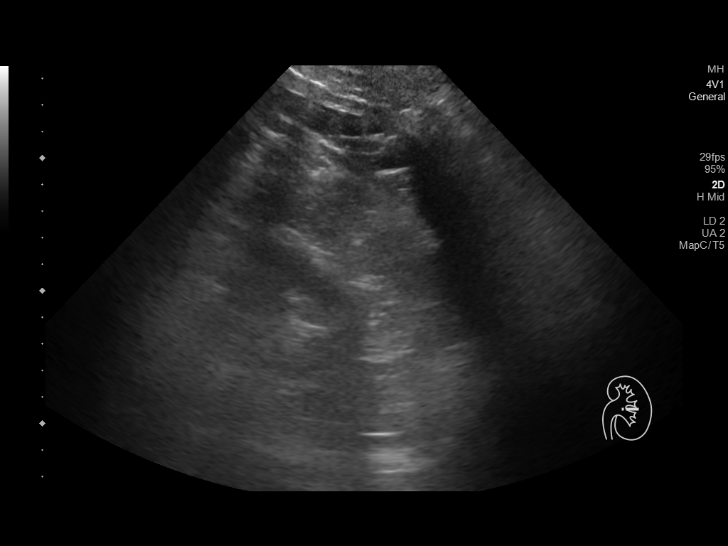
[im 34/37]
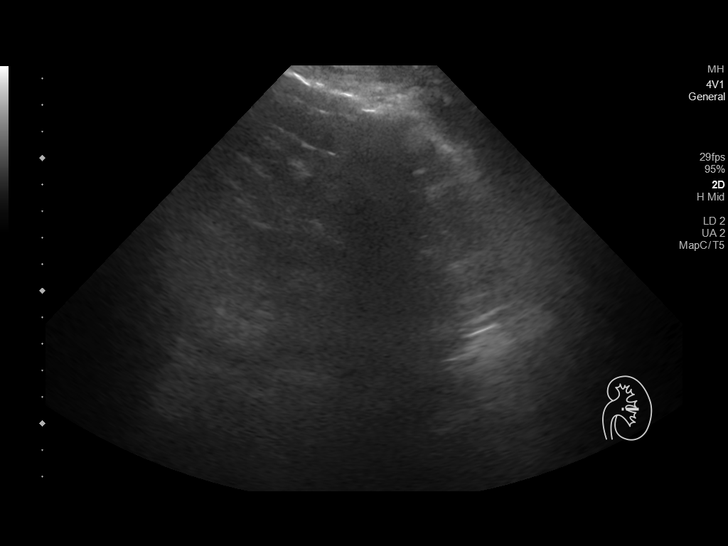
[im 37/37]
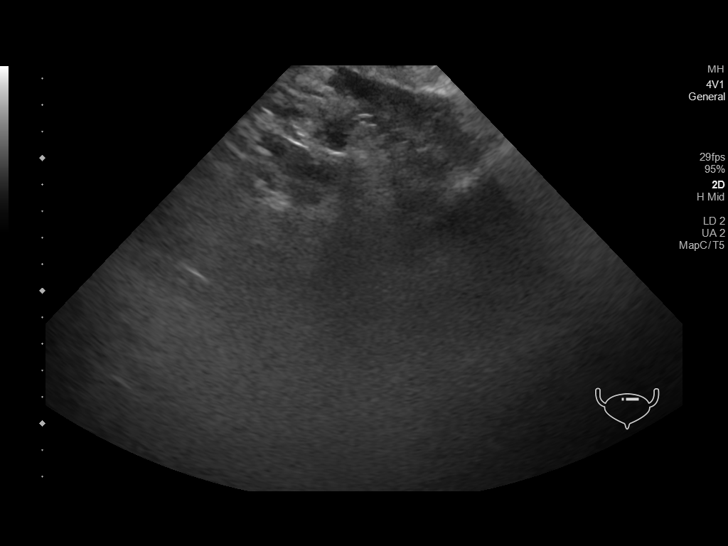

[14 of 25 positions shown; findings below may reference images not displayed]

FINDINGS: Right Kidney:

Renal measurements: 12.3 x 5.5 x 5.8 cm = volume: 205 mL. Cortical
thinning. Increased cortical echogenicity. No mass, hydronephrosis
or shadowing calcification.

Left Kidney:

Renal measurements: 12.9 x 5.1 x 4.8 cm = volume: 164 mL. Suboptimal
visualization due to body habitus. Cortical thinning. No gross mass
or hydronephrosis identified on limited assessment.

Bladder:

Appears normal for degree of bladder distention.

Small amount of ascites is noted in RIGHT upper quadrant.
IMPRESSION: Renal cortical atrophy and suspected medical renal disease changes.

No gross evidence of renal mass or hydronephrosis identified though
assessment of the LEFT kidney is suboptimal.

Small volume ascites RIGHT upper quadrant.
